# Patient Record
Sex: Male | Born: 1980
Health system: Southern US, Community
[De-identification: ages and names within clinical notes are randomized; demographics above are authoritative.]

---

## 2008-11-26 ENCOUNTER — Emergency Department (HOSPITAL_COMMUNITY): Admission: EM | Admit: 2008-11-26 | Discharge: 2008-11-26 | Payer: Self-pay | Admitting: Emergency Medicine

## 2009-02-26 ENCOUNTER — Emergency Department (HOSPITAL_COMMUNITY): Admission: EM | Admit: 2009-02-26 | Discharge: 2009-02-26 | Payer: Self-pay | Admitting: Emergency Medicine

## 2009-08-17 ENCOUNTER — Emergency Department (HOSPITAL_COMMUNITY): Admission: EM | Admit: 2009-08-17 | Discharge: 2009-08-17 | Payer: Self-pay | Admitting: Emergency Medicine

## 2010-05-16 ENCOUNTER — Emergency Department (HOSPITAL_COMMUNITY)
Admission: EM | Admit: 2010-05-16 | Discharge: 2010-05-17 | Payer: Self-pay | Source: Home / Self Care | Admitting: Emergency Medicine

## 2010-07-26 LAB — CBC
HCT: 48.2 % (ref 39.0–52.0)
MCH: 29.8 pg (ref 26.0–34.0)
MCV: 84.9 fL (ref 78.0–100.0)
RDW: 13.2 % (ref 11.5–15.5)
WBC: 9.9 10*3/uL (ref 4.0–10.5)

## 2010-07-26 LAB — URINALYSIS, ROUTINE W REFLEX MICROSCOPIC
Nitrite: NEGATIVE
Specific Gravity, Urine: 1.021 (ref 1.005–1.030)
Urobilinogen, UA: 0.2 mg/dL (ref 0.0–1.0)

## 2010-07-26 LAB — BASIC METABOLIC PANEL
BUN: 11 mg/dL (ref 6–23)
CO2: 24 mEq/L (ref 19–32)
Chloride: 105 mEq/L (ref 96–112)
Potassium: 4.2 mEq/L (ref 3.5–5.1)

## 2010-07-26 LAB — POCT CARDIAC MARKERS
CKMB, poc: 10.2 ng/mL (ref 1.0–8.0)
Troponin i, poc: <0.05 ng/mL (ref 0.00–0.09)

## 2010-07-26 LAB — DIFFERENTIAL
Basophils Absolute: 0 10*3/uL (ref 0.0–0.1)
Eosinophils Absolute: 0.1 10*3/uL (ref 0.0–0.7)
Eosinophils Relative: 1 % (ref 0–5)
Lymphocytes Relative: 7 % — ABNORMAL LOW (ref 12–46)
Lymphs Abs: 0.7 10*3/uL (ref 0.7–4.0)
Monocytes Absolute: 0.5 10*3/uL (ref 0.1–1.0)

## 2010-07-28 ENCOUNTER — Emergency Department (HOSPITAL_COMMUNITY)
Admission: EM | Admit: 2010-07-28 | Discharge: 2010-07-28 | Disposition: A | Discharge status: Home / Self Care | Payer: Medicaid Other | Attending: Emergency Medicine | Admitting: Emergency Medicine

## 2010-07-28 DIAGNOSIS — J45909 Unspecified asthma, uncomplicated: Secondary | ICD-10-CM | POA: Insufficient documentation

## 2010-07-28 DIAGNOSIS — J069 Acute upper respiratory infection, unspecified: Secondary | ICD-10-CM | POA: Insufficient documentation

## 2010-07-28 DIAGNOSIS — J029 Acute pharyngitis, unspecified: Secondary | ICD-10-CM | POA: Insufficient documentation

## 2010-07-28 LAB — RAPID STREP SCREEN (MED CTR MEBANE ONLY): Streptococcus, Group A Screen (Direct): NEGATIVE

## 2010-08-04 LAB — RAPID STREP SCREEN (MED CTR MEBANE ONLY): Streptococcus, Group A Screen (Direct): NEGATIVE

## 2011-02-27 ENCOUNTER — Emergency Department (HOSPITAL_COMMUNITY)
Admission: EM | Admit: 2011-02-27 | Discharge: 2011-02-28 | Disposition: A | Discharge status: Home / Self Care | Payer: Medicaid Other | Attending: Emergency Medicine | Admitting: Emergency Medicine

## 2011-02-27 ENCOUNTER — Emergency Department (HOSPITAL_COMMUNITY): Payer: Medicaid Other

## 2011-02-27 DIAGNOSIS — R0609 Other forms of dyspnea: Secondary | ICD-10-CM | POA: Insufficient documentation

## 2011-02-27 DIAGNOSIS — J45909 Unspecified asthma, uncomplicated: Secondary | ICD-10-CM | POA: Insufficient documentation

## 2011-02-27 DIAGNOSIS — R0789 Other chest pain: Secondary | ICD-10-CM | POA: Insufficient documentation

## 2011-02-27 DIAGNOSIS — R0989 Other specified symptoms and signs involving the circulatory and respiratory systems: Secondary | ICD-10-CM | POA: Insufficient documentation

## 2011-02-27 DIAGNOSIS — R0602 Shortness of breath: Secondary | ICD-10-CM | POA: Insufficient documentation

## 2011-02-27 LAB — COMPREHENSIVE METABOLIC PANEL
AST: 26 U/L (ref 0–37)
Albumin: 3.3 g/dL — ABNORMAL LOW (ref 3.5–5.2)
Calcium: 8.8 mg/dL (ref 8.4–10.5)
Creatinine, Ser: 1.21 mg/dL (ref 0.50–1.35)
GFR calc non Af Amer: 80 mL/min — ABNORMAL LOW (ref >90)

## 2011-02-27 LAB — POCT I-STAT TROPONIN I: Troponin i, poc: 0 ng/mL (ref 0.00–0.08)

## 2011-02-27 LAB — CBC
MCHC: 35.2 g/dL (ref 30.0–36.0)
Platelets: 168 10*3/uL (ref 150–400)
RDW: 13.4 % (ref 11.5–15.5)

## 2011-06-11 ENCOUNTER — Emergency Department (HOSPITAL_COMMUNITY)
Admission: EM | Admit: 2011-06-11 | Discharge: 2011-06-12 | Disposition: A | Discharge status: Home / Self Care | Payer: No Typology Code available for payment source | Attending: Emergency Medicine | Admitting: Emergency Medicine

## 2011-06-11 ENCOUNTER — Encounter (HOSPITAL_COMMUNITY): Payer: Self-pay | Admitting: *Deleted

## 2011-06-11 DIAGNOSIS — J45909 Unspecified asthma, uncomplicated: Secondary | ICD-10-CM | POA: Insufficient documentation

## 2011-06-11 DIAGNOSIS — M546 Pain in thoracic spine: Secondary | ICD-10-CM | POA: Insufficient documentation
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
## 2011-06-12 ENCOUNTER — Emergency Department (HOSPITAL_COMMUNITY): Payer: No Typology Code available for payment source

## 2011-06-12 MED ORDER — CYCLOBENZAPRINE HCL 10 MG PO TABS
5.0000 mg | ORAL_TABLET | Freq: Once | ORAL | Status: AC
  Administered 2011-06-12: 5 mg via ORAL
  Filled 2011-06-12: qty 1

## 2011-06-12 MED ORDER — CYCLOBENZAPRINE HCL 10 MG PO TABS: 10.0000 mg | ORAL_TABLET | Freq: Two times a day (BID) | ORAL | Status: AC | PRN

## 2011-06-12 MED ORDER — IBUPROFEN 800 MG PO TABS: 800.0000 mg | ORAL_TABLET | Freq: Three times a day (TID) | ORAL | Status: AC

## 2011-06-12 MED ORDER — IBUPROFEN 800 MG PO TABS
800.0000 mg | ORAL_TABLET | Freq: Once | ORAL | Status: AC
  Administered 2011-06-12: 800 mg via ORAL
  Filled 2011-06-12: qty 1

## 2011-06-12 NOTE — ED Notes (Signed)
Ice pack applied to upper back
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
## 2011-11-12 IMAGING — CR DG CHEST 2V
2 series · 2 of 2 positions shown · non-contrast
Comparison: None.

CLINICAL DATA: Chest pain and shortness of breath, with nausea and
vomiting; history of asthma.

CHEST - 2 VIEW

[w chest pa]
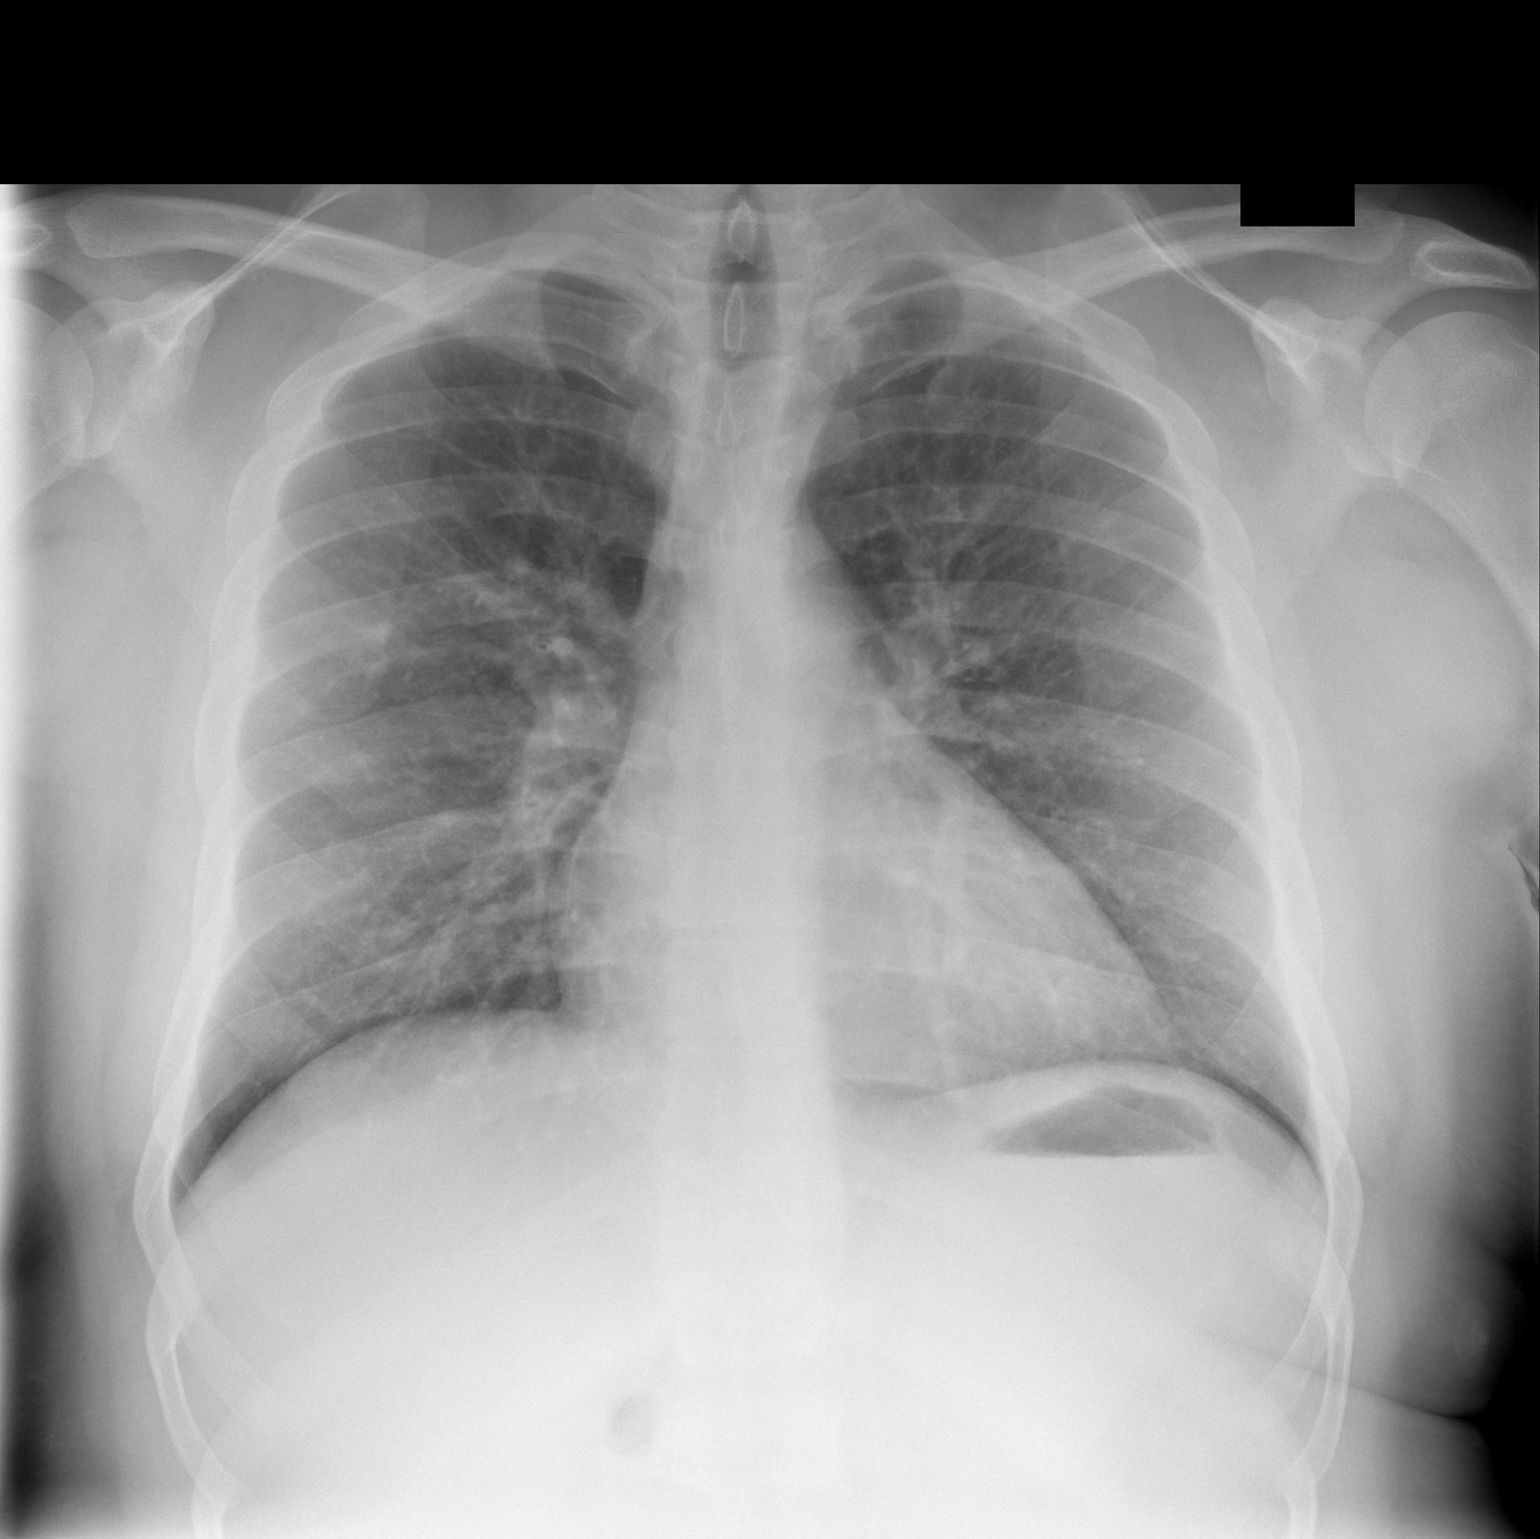

[w chest lat]
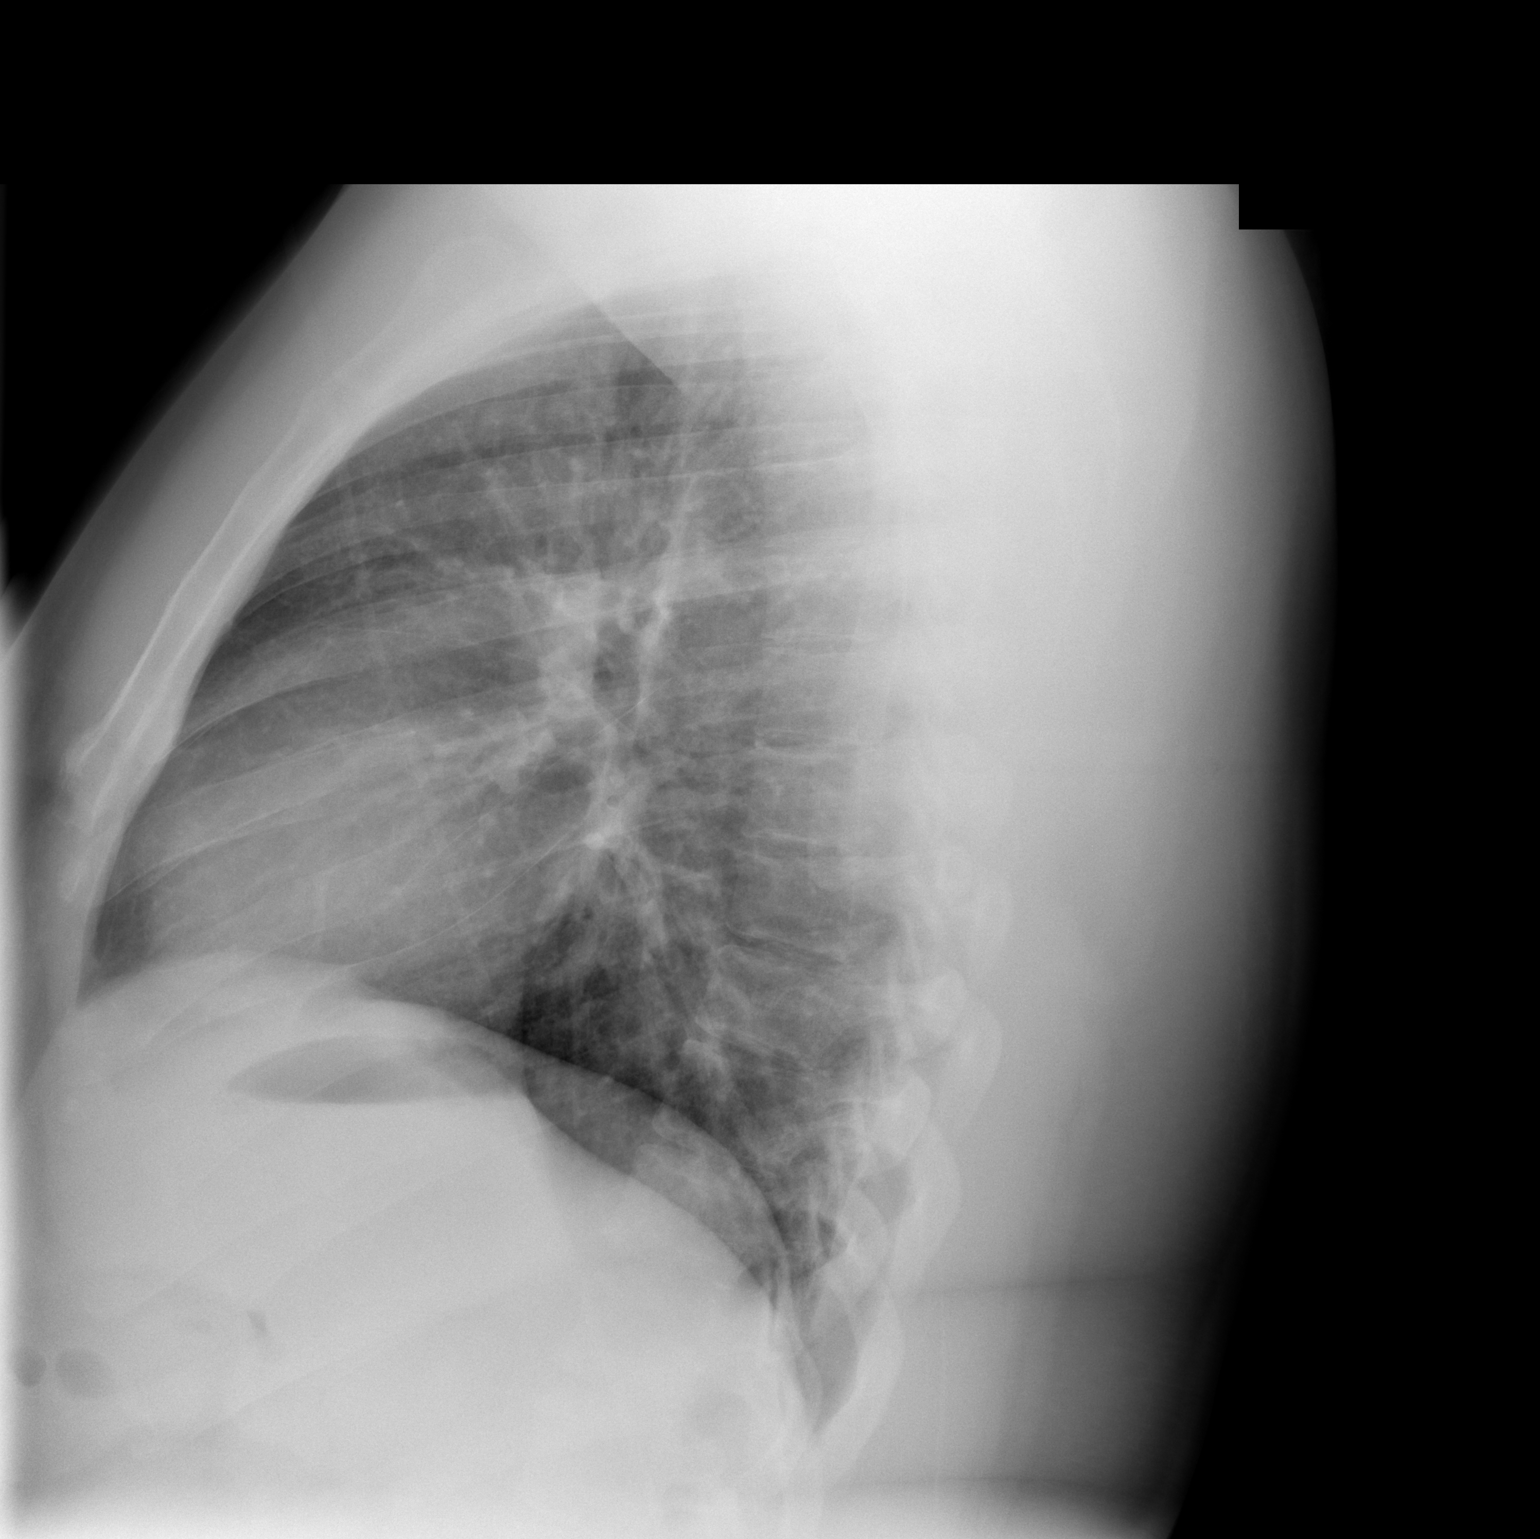

[2 of 2 positions shown; findings below may reference images not displayed]

FINDINGS: The lungs are well-aerated; mild peribronchial thickening
is noted.  There is no evidence of focal opacification, pleural
effusion or pneumothorax.  A nodular density at the right mid lung
zone likely reflects a bone island, particularly given the
patient's age.

The heart is normal in size; the mediastinal contour is within
normal limits.  No acute osseous abnormalities are seen.
IMPRESSION: Mild peribronchial thickening noted; lungs otherwise clear.

## 2011-11-16 ENCOUNTER — Encounter (HOSPITAL_COMMUNITY): Payer: Self-pay | Admitting: *Deleted

## 2011-11-16 ENCOUNTER — Emergency Department (HOSPITAL_COMMUNITY): Payer: Medicaid Other

## 2011-11-16 ENCOUNTER — Emergency Department (HOSPITAL_COMMUNITY)
Admission: EM | Admit: 2011-11-16 | Discharge: 2011-11-16 | Disposition: A | Discharge status: Home / Self Care | Payer: Medicaid Other | Attending: Emergency Medicine | Admitting: Emergency Medicine

## 2011-11-16 DIAGNOSIS — J209 Acute bronchitis, unspecified: Secondary | ICD-10-CM | POA: Insufficient documentation

## 2011-11-16 DIAGNOSIS — R0602 Shortness of breath: Secondary | ICD-10-CM | POA: Insufficient documentation

## 2011-11-16 DIAGNOSIS — J45901 Unspecified asthma with (acute) exacerbation: Secondary | ICD-10-CM | POA: Insufficient documentation

## 2011-11-16 MED ORDER — AMOXICILLIN 500 MG PO CAPS: 500.0000 mg | ORAL_CAPSULE | Freq: Three times a day (TID) | ORAL | Status: AC

## 2011-11-16 MED ORDER — ALBUTEROL SULFATE HFA 108 (90 BASE) MCG/ACT IN AERS
2.0000 | INHALATION_SPRAY | Freq: Once | RESPIRATORY_TRACT | Status: AC
  Administered 2011-11-16: 2 via RESPIRATORY_TRACT
  Filled 2011-11-16: qty 6.7

## 2011-11-16 NOTE — ED Notes (Signed)
Pt d/c home in NAD. Pt voiced understanding of d/c instructions and follow up care. Pt denies any pain.

## 2011-11-16 NOTE — ED Notes (Signed)
Pt is here for asthma flare up.  Pt has had a cough for 2-3 days with some soreness in his left ribs when he coughs and some intermittent sob.  Pt ran out of his meds today and would also like a refill.  No distress in triage

## 2011-11-16 NOTE — ED Provider Notes (Signed)
History     CSN: [id number]  Arrival date & time 11/16/11  2031   First MD Initiated Contact with Patient 11/16/11 2227      Chief Complaint  Patient presents with  . Asthma    (Consider location/radiation/quality/duration/timing/severity/associated sxs/prior treatment) Patient is a 31 y.o. male presenting with asthma. The history is provided by the patient.  Asthma This is a recurrent problem. Episode onset: 4-5 days ago. The problem occurs constantly. The problem has been gradually worsening. Associated symptoms include shortness of breath. Pertinent negatives include no chest pain. Nothing aggravates the symptoms. Nothing relieves the symptoms. Treatments tried: mdi but ran out of today. The treatment provided no relief.    Past Medical History  Diagnosis Date  . Asthma     History reviewed. No pertinent past surgical history.  No family history on file.  History  Substance Use Topics  . Smoking status: Never Smoker   . Smokeless tobacco: Not on file  . Alcohol Use: Yes      Review of Systems  Respiratory: Positive for shortness of breath.   Cardiovascular: Negative for chest pain.  All other systems reviewed and are negative.    Allergies  Review of patient's allergies indicates no known allergies.  Home Medications   Current Outpatient Rx  Name Route Sig Dispense Refill  . ALBUTEROL SULFATE HFA 108 (90 BASE) MCG/ACT IN AERS Inhalation Inhale 2 puffs into the lungs every 4 (four) hours as needed. For shortness of breath/asthma symptoms    . OXYCODONE-ACETAMINOPHEN 5-325 MG PO TABS Oral Take 1 tablet by mouth every 6 (six) hours as needed. For pain.      BP 126/76  Pulse 89  Temp 98.8 F (37.1 C) (Oral)  Resp 22  SpO2 98%  Physical Exam  Nursing note and vitals reviewed. Constitutional: He is oriented to person, place, and time. He appears well-developed and well-nourished. No distress.  HENT:  Head: Normocephalic and atraumatic.  Mouth/Throat:  Oropharynx is clear and moist.  Neck: Normal range of motion. Neck supple.  Cardiovascular: Normal rate and regular rhythm.   No murmur heard. Pulmonary/Chest: Effort normal and breath sounds normal. No respiratory distress.  Abdominal: Soft. Bowel sounds are normal. He exhibits no distension. There is no tenderness.  Musculoskeletal: Normal range of motion. He exhibits no edema.  Lymphadenopathy:    He has no cervical adenopathy.  Neurological: He is alert and oriented to person, place, and time.  Skin: Skin is warm and dry. He is not diaphoretic.    ED Course  Procedures (including critical care time)  Labs Reviewed - No data to display Dg Chest 2 View  11/16/2011  *RADIOLOGY REPORT*  Clinical Data: Asthma.  CHEST - 2 VIEW  Comparison: 02/28/2011  Findings: Stable mild peribronchial thickening. Heart and mediastinal contours are within normal limits.  No focal opacities or effusions.  No acute bony abnormality.  IMPRESSION: Stable bronchitic changes.  Original Report Authenticated By: Cyndie Chime, M.D.     No diagnosis found.    MDM  The xrays show bronchitic changes.  Will treat with amox, mdi.  To return prn if worsens.        Geoffery Lyons, MD 11/16/11 2235

## 2011-11-16 NOTE — ED Notes (Signed)
Pt states "I've been having issues with my asthma the past 2-3 days because I ran out of my inhaler. I also have a pain in here (points to left side of ribs) when I cough." Pt in NAD. Pt states that he is coughing up clear phlegm. Lungs clear to auscultation. 98% on RA.

## 2012-03-04 ENCOUNTER — Emergency Department (HOSPITAL_COMMUNITY)
Admission: EM | Admit: 2012-03-04 | Discharge: 2012-03-05 | Disposition: A | Discharge status: Home / Self Care | Payer: No Typology Code available for payment source | Attending: Emergency Medicine | Admitting: Emergency Medicine

## 2012-03-04 ENCOUNTER — Encounter (HOSPITAL_COMMUNITY): Payer: Self-pay | Admitting: *Deleted

## 2012-03-04 DIAGNOSIS — M549 Dorsalgia, unspecified: Secondary | ICD-10-CM | POA: Insufficient documentation

## 2012-03-04 DIAGNOSIS — Y92410 Unspecified street and highway as the place of occurrence of the external cause: Secondary | ICD-10-CM | POA: Insufficient documentation

## 2012-03-04 DIAGNOSIS — J45909 Unspecified asthma, uncomplicated: Secondary | ICD-10-CM | POA: Insufficient documentation
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
## 2012-03-05 ENCOUNTER — Emergency Department (HOSPITAL_COMMUNITY): Payer: No Typology Code available for payment source

## 2012-03-05 DIAGNOSIS — M549 Dorsalgia, unspecified: Secondary | ICD-10-CM | POA: Diagnosis not present

## 2012-03-05 MED ORDER — KETOROLAC TROMETHAMINE 60 MG/2ML IM SOLN
60.0000 mg | Freq: Once | INTRAMUSCULAR | Status: AC
  Administered 2012-03-05: 60 mg via INTRAMUSCULAR
  Filled 2012-03-05: qty 2

## 2012-03-05 MED ORDER — NAPROXEN 500 MG PO TABS: 500.0000 mg | ORAL_TABLET | Freq: Two times a day (BID) | ORAL | Status: DC
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
## 2012-04-24 ENCOUNTER — Emergency Department (HOSPITAL_COMMUNITY)
Admission: EM | Admit: 2012-04-24 | Discharge: 2012-04-24 | Disposition: A | Discharge status: Home / Self Care | Payer: Medicaid Other | Source: Home / Self Care | Attending: Emergency Medicine | Admitting: Emergency Medicine

## 2012-04-24 ENCOUNTER — Emergency Department (INDEPENDENT_AMBULATORY_CARE_PROVIDER_SITE_OTHER): Payer: Medicaid Other

## 2012-04-24 ENCOUNTER — Encounter (HOSPITAL_COMMUNITY): Payer: Self-pay | Admitting: *Deleted

## 2012-04-24 DIAGNOSIS — M7750 Other enthesopathy of unspecified foot: Secondary | ICD-10-CM

## 2012-04-24 DIAGNOSIS — M659 Synovitis and tenosynovitis, unspecified: Secondary | ICD-10-CM

## 2012-04-24 MED ORDER — MELOXICAM 7.5 MG PO TABS: 7.5000 mg | ORAL_TABLET | Freq: Every day | ORAL | Status: DC

## 2012-04-24 NOTE — ED Provider Notes (Signed)
History     CSN: [id number]  Arrival date & time 04/24/12  1434   First MD Initiated Contact with Patient 04/24/12 1532      Chief Complaint  Patient presents with  . Foot Pain    (Consider location/radiation/quality/duration/timing/severity/associated sxs/prior treatment) HPI Comments: The patient presents urgent care complaining of right foot pain for about a week. He denies any known injuries portions falls uses hurting in the lateral aspect of his right foot. He does describe that he's been walking a lot more lately. Denies any numbness, or tingling sensation distally or proximally to his right foot.  Patient is a 31 y.o. male presenting with lower extremity pain. The history is provided by the patient.  Foot Pain This is a new problem. The problem occurs constantly. The problem has been gradually worsening. Pertinent negatives include no chest pain. The symptoms are aggravated by walking. He has tried nothing for the symptoms. The treatment provided no relief.    Past Medical History  Diagnosis Date  . Asthma     History reviewed. No pertinent past surgical history.  History reviewed. No pertinent family history.  History  Substance Use Topics  . Smoking status: Never Smoker   . Smokeless tobacco: Not on file  . Alcohol Use: Yes      Review of Systems  Constitutional: Positive for activity change. Negative for fever, chills, diaphoresis, appetite change and fatigue.  Cardiovascular: Negative for chest pain.  Gastrointestinal: Negative for vomiting.  Musculoskeletal: Negative for myalgias, back pain and arthralgias.  Skin: Negative for color change, rash and wound.    Allergies  Review of patient's allergies indicates no known allergies.  Home Medications   Current Outpatient Rx  Name  Route  Sig  Dispense  Refill  . ALBUTEROL SULFATE HFA 108 (90 BASE) MCG/ACT IN AERS   Inhalation   Inhale 2 puffs into the lungs every 4 (four) hours as needed. For  shortness of breath/asthma symptoms         . NAPROXEN 500 MG PO TABS   Oral   Take 1 tablet (500 mg total) by mouth 2 (two) times daily with a meal.   30 tablet   0     BP 147/72  Pulse 83  Temp 98.3 F (36.8 C) (Oral)  Resp 20  SpO2 96%  Physical Exam  Nursing note and vitals reviewed. Constitutional: He is oriented to person, place, and time. Vital signs are normal. He appears well-developed and well-nourished.  Non-toxic appearance. He does not have a sickly appearance. He does not appear ill. No distress.  Musculoskeletal: He exhibits tenderness. He exhibits no edema.       Feet:  Neurological: He is alert and oriented to person, place, and time.  Skin: Skin is dry. No rash noted. No erythema.    ED Course  Procedures (including critical care time)  Labs Reviewed - No data to display No results found.   No diagnosis found.    MDM  Right foot pain/midfoot area fifth metatarsal region. We are x-ray and his foot to rule out a stress fracture. Will probably be put on a postop shoe and encouraged to followup in 4 to about 10-14 days.        Jimmie Molly, MD 04/24/12 [phone]

## 2012-04-24 NOTE — ED Notes (Signed)
Pt reports 1 week of right lateral foot pain.  He denies injury, but has walked a lot lately

## 2012-04-30 ENCOUNTER — Encounter (HOSPITAL_COMMUNITY): Payer: Self-pay | Admitting: Emergency Medicine

## 2012-04-30 ENCOUNTER — Other Ambulatory Visit: Payer: Self-pay

## 2012-04-30 ENCOUNTER — Emergency Department (HOSPITAL_COMMUNITY)
Admission: EM | Admit: 2012-04-30 | Discharge: 2012-04-30 | Disposition: A | Discharge status: Home / Self Care | Payer: Medicaid Other | Attending: Emergency Medicine | Admitting: Emergency Medicine

## 2012-04-30 ENCOUNTER — Emergency Department (HOSPITAL_COMMUNITY): Payer: Medicaid Other

## 2012-04-30 DIAGNOSIS — Z79899 Other long term (current) drug therapy: Secondary | ICD-10-CM | POA: Insufficient documentation

## 2012-04-30 DIAGNOSIS — J45901 Unspecified asthma with (acute) exacerbation: Secondary | ICD-10-CM | POA: Insufficient documentation

## 2012-04-30 MED ORDER — DEXAMETHASONE 1 MG/ML PO CONC
16.0000 mg | Freq: Once | ORAL | Status: DC
  Filled 2012-04-30: qty 16

## 2012-04-30 MED ORDER — DEXAMETHASONE 4 MG PO TABS: 4.0000 mg | ORAL_TABLET | Freq: Two times a day (BID) | ORAL | Status: DC

## 2012-04-30 MED ORDER — DEXAMETHASONE 6 MG PO TABS
16.0000 mg | ORAL_TABLET | Freq: Once | ORAL | Status: AC
  Administered 2012-04-30: 16 mg via ORAL
  Filled 2012-04-30: qty 1

## 2012-04-30 MED ORDER — LORAZEPAM 1 MG PO TABS
1.0000 mg | ORAL_TABLET | Freq: Once | ORAL | Status: AC
  Administered 2012-04-30: 1 mg via ORAL
  Filled 2012-04-30: qty 1

## 2012-04-30 MED ORDER — ALBUTEROL SULFATE (5 MG/ML) 0.5% IN NEBU
15.0000 mg | INHALATION_SOLUTION | Freq: Once | RESPIRATORY_TRACT | Status: AC
  Administered 2012-04-30: 15 mg via RESPIRATORY_TRACT
  Filled 2012-04-30 (×2): qty 3

## 2012-04-30 NOTE — ED Notes (Signed)
MWU:[id number]<MW> Expected date:<BR> Expected time:<BR> Means of arrival:<BR> Comments:<BR> EMS/dyspnea/wheezing-neb tx being given

## 2012-04-30 NOTE — ED Provider Notes (Signed)
History    31 year old male with a chief complaint of dyspnea. Onset about 2 days ago and progressively worsening. Patient has a history of asthma and says her current symptoms feel like an exacerbation. He has been using albuterol at home with minimal relief. No fevers or chills. His chest is tight. A history of blood clot. No unusual leg pain or swelling. Patient has been previously admitted for asthma exacerbation but has never been intubated or to the ICU.  CSN: [id number]  Arrival date & time 04/30/12  2103   None     Chief Complaint  Patient presents with  . Shortness of Breath    (Consider location/radiation/quality/duration/timing/severity/associated sxs/prior treatment) HPI  Past Medical History  Diagnosis Date  . Asthma     No past surgical history on file.  No family history on file.  History  Substance Use Topics  . Smoking status: Never Smoker   . Smokeless tobacco: Not on file  . Alcohol Use: Yes      Review of Systems  All systems reviewed and negative, other than as noted in HPI.   Allergies  Review of patient's allergies indicates no known allergies.  Home Medications   Current Outpatient Rx  Name  Route  Sig  Dispense  Refill  . ALBUTEROL SULFATE HFA 108 (90 BASE) MCG/ACT IN AERS   Inhalation   Inhale 2 puffs into the lungs every 4 (four) hours as needed. For shortness of breath/asthma symptoms         . MELOXICAM 7.5 MG PO TABS   Oral   Take 1 tablet (7.5 mg total) by mouth daily. Take one tablet daily for 2 weeks   14 tablet   0     BP 163/79  Pulse 116  Temp 100 F (37.8 C) (Axillary)  Resp 21  SpO2 97%  Physical Exam  Nursing note and vitals reviewed. Constitutional: He appears well-developed and well-nourished. No distress.  HENT:  Head: Normocephalic and atraumatic.  Eyes: Conjunctivae normal are normal. Right eye exhibits no discharge. Left eye exhibits no discharge.  Neck: Neck supple.  Cardiovascular: Regular  rhythm and normal heart sounds.  Exam reveals no gallop and no friction rub.   No murmur heard.      Tachycardic with a regular rhythm  Pulmonary/Chest:       Mild tachypnea. Expiratory wheezing bilaterally. Mildly prolonged expiratory phase the  Abdominal: Soft. He exhibits no distension. There is no tenderness.  Musculoskeletal: He exhibits no edema and no tenderness.       Lower extremities symmetric as compared to each other. No calf tenderness. Negative Homan's. No palpable cords.   Neurological: He is alert.  Skin: Skin is warm and dry.  Psychiatric: He has a normal mood and affect. His behavior is normal. Thought content normal.    ED Course  Procedures (including critical care time)  Labs Reviewed - No data to display Dg Chest 2 View  04/30/2012  *RADIOLOGY REPORT*  Clinical Data: Shortness of breath.  History of asthma.  CHEST - 2 VIEW  Comparison: 11/16/2011.  Findings: The cardiac silhouette is near the upper limit of normal in size with a mild interval increase in size.  Clear lungs with normal vascularity.  Unremarkable bones.  IMPRESSION: Interval heart size near the upper limit of normal.  Otherwise, unremarkable examination.   Original Report Authenticated By: Beckie Salts, M.D.      1. Asthma exacerbation       MDM  31 year old male  with asthma exacerbation. Subjectively and clinically much better after hour-long nebulized treatment of the anus. Patient is given a dose of Decadron and a prescription for a repeat dose. He reports that he has a complaining of albuterol at home and does not need further prescription at this time. He is afebrile. Chest is clear. Neck is safe for discharge at this time. Emergent return cautions were discussed.        Raeford Razor, MD 05/02/12 [phone]

## 2012-04-30 NOTE — ED Notes (Signed)
Brought in by EMS from home with c/o shortness of breath.  Per EMS, pt has wheezes on all lung fields on their arrival.  Per pt's report, he has been having colds and SOB for the past 2 days and unrelieved by his inhalers.  Pt was given neb treatment en route to ED.  Pt presents  to room 10 ambulatory.

## 2012-09-15 ENCOUNTER — Encounter (HOSPITAL_COMMUNITY): Payer: Self-pay | Admitting: Emergency Medicine

## 2012-09-15 ENCOUNTER — Emergency Department (HOSPITAL_COMMUNITY)
Admission: EM | Admit: 2012-09-15 | Discharge: 2012-09-15 | Disposition: A | Discharge status: Home / Self Care | Payer: Medicaid Other | Attending: Emergency Medicine | Admitting: Emergency Medicine

## 2012-09-15 DIAGNOSIS — J45901 Unspecified asthma with (acute) exacerbation: Secondary | ICD-10-CM | POA: Insufficient documentation

## 2012-09-15 DIAGNOSIS — J45909 Unspecified asthma, uncomplicated: Secondary | ICD-10-CM

## 2012-09-15 DIAGNOSIS — Z79899 Other long term (current) drug therapy: Secondary | ICD-10-CM | POA: Insufficient documentation

## 2012-09-15 DIAGNOSIS — Z760 Encounter for issue of repeat prescription: Secondary | ICD-10-CM

## 2012-09-15 MED ORDER — ALBUTEROL SULFATE HFA 108 (90 BASE) MCG/ACT IN AERS: 2.0000 | INHALATION_SPRAY | RESPIRATORY_TRACT | Status: DC | PRN

## 2012-09-15 MED ORDER — ALBUTEROL SULFATE HFA 108 (90 BASE) MCG/ACT IN AERS
2.0000 | INHALATION_SPRAY | RESPIRATORY_TRACT | Status: DC
  Administered 2012-09-15: 2 via RESPIRATORY_TRACT
  Filled 2012-09-15: qty 6.7

## 2012-09-15 NOTE — ED Notes (Signed)
Pt to ED with c/o SOB onset yesterday but denies SOB breath now. Pt states he run out of albuterol healer and needs refill.

## 2012-09-15 NOTE — ED Provider Notes (Signed)
History     CSN: [id number]  Arrival date & time 09/15/12  1720   First MD Initiated Contact with Patient 09/15/12 1739      Chief Complaint  Patient presents with  . Shortness of Breath   The history is provided by the patient.   the patient reports a history of asthma but is currently out of his albuterol.  He had some shortness of breath yesterday but has no significant shortness of breath now.  He's mainly requesting an albuterol inhaler.  No fevers or chills.  No productive cough.  No unilateral leg swelling.  No other medical problems.  His symptoms are only very mild at this time.  Nothing improves or worsens the symptoms.  Past Medical History  Diagnosis Date  . Asthma     History reviewed. No pertinent past surgical history.  History reviewed. No pertinent family history.  History  Substance Use Topics  . Smoking status: Never Smoker   . Smokeless tobacco: Not on file  . Alcohol Use: Yes      Review of Systems  Respiratory: Positive for shortness of breath.   All other systems reviewed and are negative.    Allergies  Review of patient's allergies indicates no known allergies.  Home Medications   Current Outpatient Rx  Name  Route  Sig  Dispense  Refill  . albuterol (PROVENTIL HFA;VENTOLIN HFA) 108 (90 BASE) MCG/ACT inhaler   Inhalation   Inhale 2 puffs into the lungs every 4 (four) hours as needed. For shortness of breath/asthma symptoms         . albuterol (PROVENTIL HFA;VENTOLIN HFA) 108 (90 BASE) MCG/ACT inhaler   Inhalation   Inhale 2 puffs into the lungs every 4 (four) hours as needed for wheezing.   1 Inhaler   0     BP 153/77  Pulse 99  Temp(Src) 99.1 F (37.3 C) (Oral)  Resp 18  SpO2 95%  Physical Exam  Nursing note and vitals reviewed. Constitutional: He is oriented to person, place, and time. He appears well-developed and well-nourished.  HENT:  Head: Normocephalic and atraumatic.  Eyes: EOM are normal.  Neck: Normal range of  motion.  Cardiovascular: Normal rate, regular rhythm, normal heart sounds and intact distal pulses.   Pulmonary/Chest: Effort normal and breath sounds normal. No respiratory distress.  Abdominal: Soft. He exhibits no distension. There is no tenderness.  Genitourinary: Rectum normal.  Musculoskeletal: Normal range of motion.  Neurological: He is alert and oriented to person, place, and time.  Skin: Skin is warm and dry.  Psychiatric: He has a normal mood and affect. Judgment normal.    ED Course  Procedures (including critical care time)  Labs Reviewed - No data to display No results found.   1. Asthma   2. Medication refill       MDM  Patient feels much better after 2 puffs of his albuterol.  Home with albuterol.  No indication for steroids.  This was mainly a medication refill visit.        Lyanne Co, MD 09/15/12 [phone]

## 2013-02-18 ENCOUNTER — Encounter (HOSPITAL_COMMUNITY): Payer: Self-pay | Admitting: Emergency Medicine

## 2013-02-18 ENCOUNTER — Emergency Department (HOSPITAL_COMMUNITY)
Admission: EM | Admit: 2013-02-18 | Discharge: 2013-02-18 | Disposition: A | Discharge status: Home / Self Care | Payer: Medicaid Other | Attending: Emergency Medicine | Admitting: Emergency Medicine

## 2013-02-18 ENCOUNTER — Emergency Department (HOSPITAL_COMMUNITY): Payer: Medicaid Other

## 2013-02-18 DIAGNOSIS — J45901 Unspecified asthma with (acute) exacerbation: Secondary | ICD-10-CM | POA: Insufficient documentation

## 2013-02-18 DIAGNOSIS — M940 Chondrocostal junction syndrome [Tietze]: Secondary | ICD-10-CM | POA: Insufficient documentation

## 2013-02-18 DIAGNOSIS — Z79899 Other long term (current) drug therapy: Secondary | ICD-10-CM | POA: Insufficient documentation

## 2013-02-18 LAB — BASIC METABOLIC PANEL
BUN: 13 mg/dL (ref 6–23)
CO2: 21 mEq/L (ref 19–32)
Calcium: 8.4 mg/dL (ref 8.4–10.5)
Creatinine, Ser: 0.94 mg/dL (ref 0.50–1.35)

## 2013-02-18 LAB — CBC WITH DIFFERENTIAL/PLATELET
Basophils Absolute: 0 10*3/uL (ref 0.0–0.1)
Eosinophils Relative: 5 % (ref 0–5)
HCT: 44.5 % (ref 39.0–52.0)
Hemoglobin: 15.5 g/dL (ref 13.0–17.0)
Lymphocytes Relative: 35 % (ref 12–46)
MCHC: 34.8 g/dL (ref 30.0–36.0)
MCV: 86.1 fL (ref 78.0–100.0)
Monocytes Absolute: 0.4 10*3/uL (ref 0.1–1.0)
Monocytes Relative: 7 % (ref 3–12)
RDW: 13.2 % (ref 11.5–15.5)
WBC: 5.6 10*3/uL (ref 4.0–10.5)

## 2013-02-18 LAB — POCT I-STAT TROPONIN I: Troponin i, poc: 0 ng/mL (ref 0.00–0.08)

## 2013-02-18 MED ORDER — PREDNISONE 50 MG PO TABS: 50.0000 mg | ORAL_TABLET | Freq: Every day | ORAL | Status: DC

## 2013-02-18 MED ORDER — IBUPROFEN 600 MG PO TABS: 600.0000 mg | ORAL_TABLET | Freq: Four times a day (QID) | ORAL | Status: DC | PRN

## 2013-02-18 MED ORDER — IPRATROPIUM BROMIDE 0.02 % IN SOLN
0.5000 mg | Freq: Once | RESPIRATORY_TRACT | Status: AC
  Administered 2013-02-18: 0.5 mg via RESPIRATORY_TRACT
  Filled 2013-02-18: qty 2.5

## 2013-02-18 MED ORDER — PREDNISONE 20 MG PO TABS
60.0000 mg | ORAL_TABLET | Freq: Once | ORAL | Status: AC
  Administered 2013-02-18: 60 mg via ORAL
  Filled 2013-02-18: qty 3

## 2013-02-18 MED ORDER — ALBUTEROL (5 MG/ML) CONTINUOUS INHALATION SOLN
10.0000 mg/h | INHALATION_SOLUTION | Freq: Once | RESPIRATORY_TRACT | Status: AC
  Administered 2013-02-18: 10 mg/h via RESPIRATORY_TRACT
  Filled 2013-02-18: qty 20

## 2013-02-18 MED ORDER — KETOROLAC TROMETHAMINE 30 MG/ML IJ SOLN
30.0000 mg | Freq: Once | INTRAMUSCULAR | Status: AC
  Administered 2013-02-18: 30 mg via INTRAVENOUS
  Filled 2013-02-18: qty 1

## 2013-02-18 NOTE — ED Provider Notes (Addendum)
CSN: [id number]     Arrival date & time 02/18/13  7322 History   First MD Initiated Contact with Patient 02/18/13 1002     Chief Complaint  Patient presents with  . Chest Pain  . Shortness of Breath   (Consider location/radiation/quality/duration/timing/severity/associated sxs/prior Treatment) HPI Comments: Pt comes in with cc of chest pain. Pt has hx of asthma, no other medical hx, doesn't smoke cigarettes and no premature CAD in the family. Reports that he has been having some chest pain since this am, right sided ,worse with inspiration and cough. The pain is not exertional. No n/v/f/c. No wheezing, new cough. Pt took albuterol, no relief. No hx of PE, DVT, and no risk factors for the same.   Patient is a 32 y.o. male presenting with chest pain and shortness of breath. The history is provided by the patient.  Chest Pain Associated symptoms: no abdominal pain, no cough and no shortness of breath   Shortness of Breath Associated symptoms: chest pain   Associated symptoms: no abdominal pain, no cough and no wheezing     Past Medical History  Diagnosis Date  . Asthma    History reviewed. No pertinent past surgical history. History reviewed. No pertinent family history. History  Substance Use Topics  . Smoking status: Never Smoker   . Smokeless tobacco: Not on file  . Alcohol Use: Yes    Review of Systems  Constitutional: Negative for activity change and appetite change.  Respiratory: Negative for cough, shortness of breath and wheezing.   Cardiovascular: Positive for chest pain.  Gastrointestinal: Negative for abdominal pain.  Genitourinary: Negative for dysuria.    Allergies  Review of patient's allergies indicates no known allergies.  Home Medications   Current Outpatient Rx  Name  Route  Sig  Dispense  Refill  . albuterol (PROVENTIL HFA;VENTOLIN HFA) 108 (90 BASE) MCG/ACT inhaler   Inhalation   Inhale 2 puffs into the lungs every 4 (four) hours as needed. For  shortness of breath/asthma symptoms         . naproxen (NAPROSYN) 375 MG tablet   Oral   Take 375 mg by mouth 2 (two) times daily as needed (pain).         Marland Kitchen oxyCODONE-acetaminophen (PERCOCET) 10-325 MG per tablet   Oral   Take 1 tablet by mouth every 4 (four) hours as needed for pain.          BP 119/78  Pulse 82  Temp(Src) 98.6 F (37 C) (Oral)  Resp 15  SpO2 100% Physical Exam  Nursing note and vitals reviewed. Constitutional: He is oriented to person, place, and time. He appears well-developed.  HENT:  Head: Normocephalic and atraumatic.  Eyes: Conjunctivae and EOM are normal. Pupils are equal, round, and reactive to light.  Neck: Normal range of motion. Neck supple. No JVD present.  Cardiovascular: Normal rate and regular rhythm.   Pulmonary/Chest: Effort normal and breath sounds normal. He has no wheezes. He has no rales. He exhibits tenderness.  Diffuse poor air entry, reproducible chest tenderness.  Abdominal: Soft. Bowel sounds are normal. He exhibits no distension. There is no tenderness. There is no rebound and no guarding.  Musculoskeletal: He exhibits no edema and no tenderness.  Neurological: He is alert and oriented to person, place, and time.  Skin: Skin is warm.    ED Course  Procedures (including critical care time) Labs Review Labs Reviewed  BASIC METABOLIC PANEL - Abnormal; Notable for the following:  Sodium 133 (*)    Glucose, Bld 111 (*)    All other components within normal limits  CBC WITH DIFFERENTIAL  POCT I-STAT TROPONIN I  POCT I-STAT TROPONIN I   Imaging Review Dg Chest Port 1 View  02/18/2013   CLINICAL DATA:  Chest pain and shortness of breath.  EXAM: PORTABLE CHEST - 1 VIEW  COMPARISON:  None.  FINDINGS: And cardiac enlargement is exaggerated by right low lung volumes. Mild pulmonary vascular congestion is evident. There is no frank edema. No focal airspace consolidation is evident. The visualized soft tissues and bony thorax are  unremarkable. Borderline cardiomegaly and mild pulmonary vascular congestion.  IMPRESSION: Borderline cardiomegaly and mild pulmonary vascular congestion.   Electronically Signed   By: Gennette Pac M.D.   On: 02/18/2013 10:43    MDM  No diagnosis found.  Pt comes in with cc of chest pain, right sided, pleuritic.  Differential diagnosis includes: ACS syndrome Myocarditis Pericarditis Pericardial effusion Pneumonia Pleural effusion PE Musculoskeletal pain  Pt has 0 cardiac risk factors. Will get trops x 2, his pain is very atypical as well for cardiac etiology.  Lung exam is + for tight chest, with mild improvement with nebs. No wheezing appreciated at any point. Will treat as asthma exacerbation/bronchitis.  Pt's WELLS score is 0 and he is PERC negative   Date: 02/18/2013  Rate: 79  Rhythm: normal sinus rhythm  QRS Axis: normal  Intervals: normal  ST/T Wave abnormalities: nonspecific ST/T changes  Conduction Disutrbances:none  Narrative Interpretation:   Old EKG Reviewed: changes noted  Mild ST changes in v1, v2 - elevation.     Derwood Kaplan, MD 02/18/13 1502  Date: 02/18/2013  Rate: 85  Rhythm: normal sinus rhythm  QRS Axis: normal  Intervals: normal  ST/T Wave abnormalities: nonspecific ST/T changes  Conduction Disutrbances:none  Narrative Interpretation:   Old EKG Reviewed: changes noted  Mild ST changes in v1, v2 - J point elevation.   Derwood Kaplan, MD 02/18/13 1659

## 2013-02-18 NOTE — ED Notes (Signed)
Pt states that he woke up with chest pain this morning with SOB. States that he used his rescue inhaler with no relief. O2 Sat 100%. Denies n/v/d.

## 2013-10-20 IMAGING — CR DG FOOT COMPLETE 3+V*R*
3 series · 3 of 3 positions shown · non-contrast
Comparison: None.

CLINICAL DATA: Pain for 1 week duration

RIGHT FOOT COMPLETE - 3+ VIEW

[view not recorded (1 of 3)]
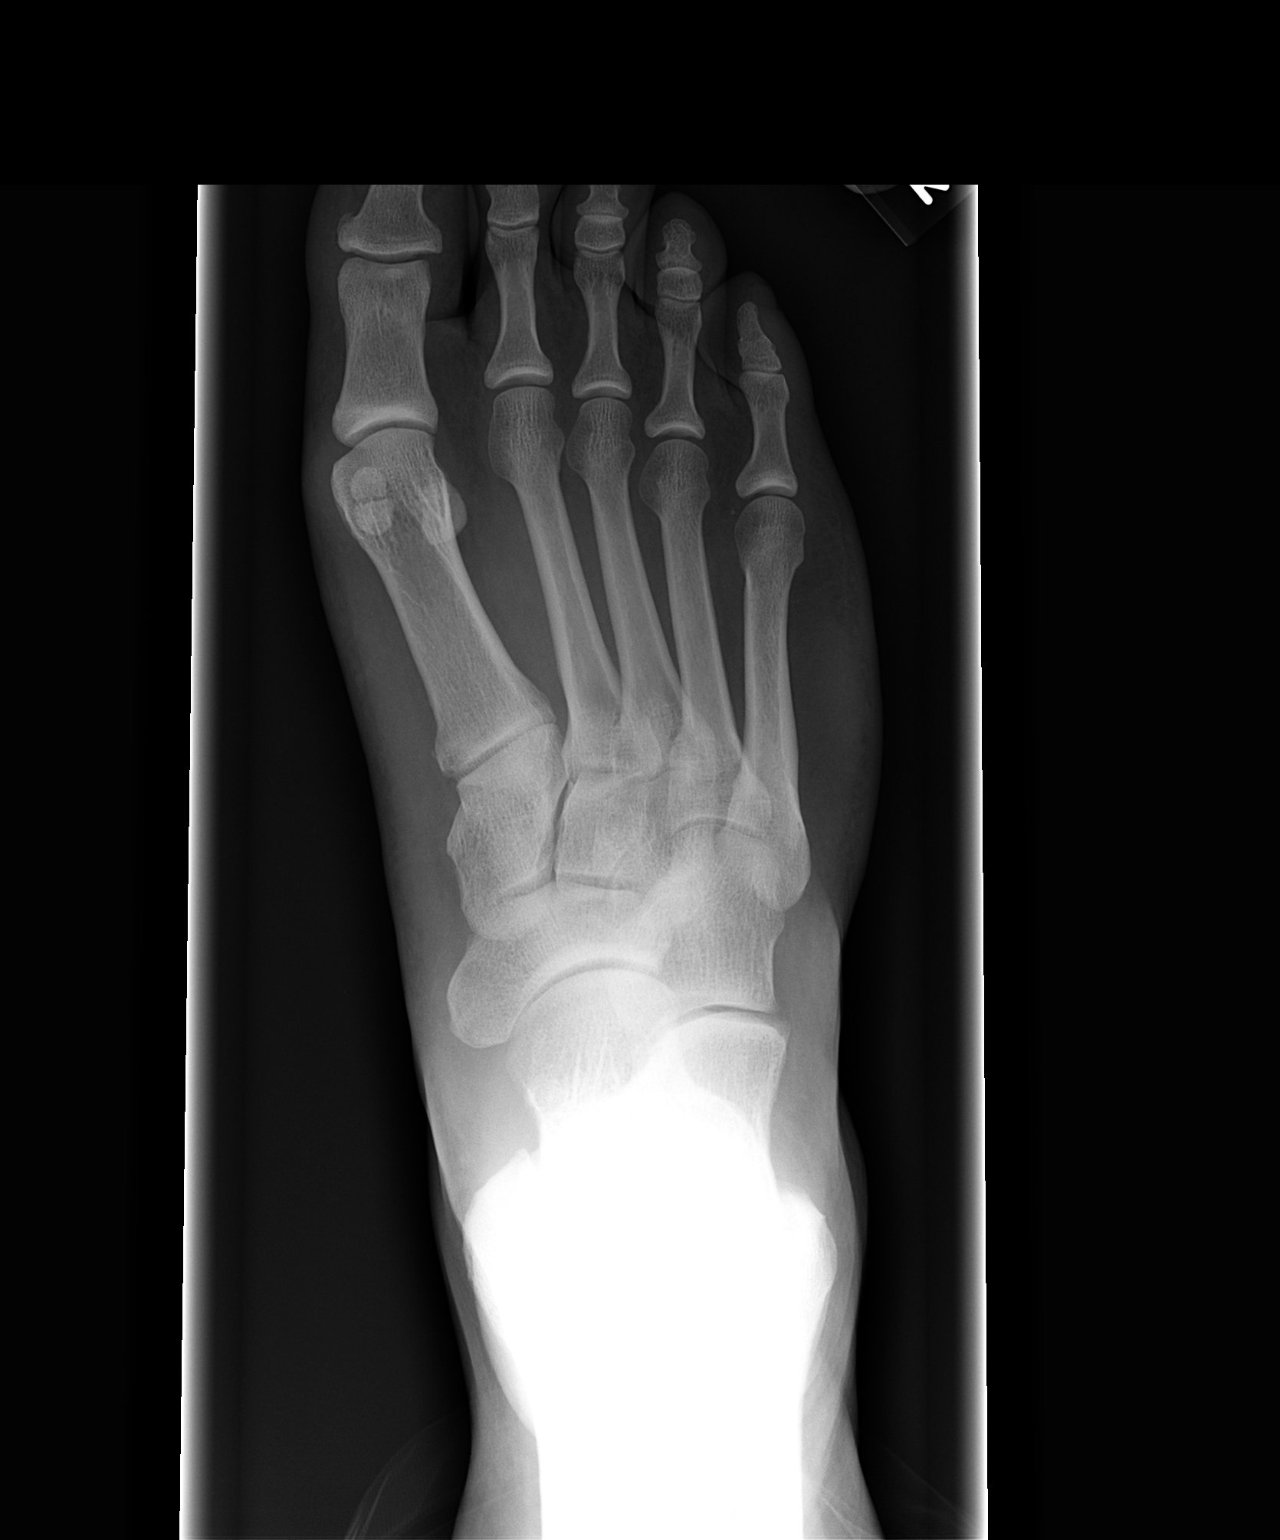

[view not recorded (2 of 3)]
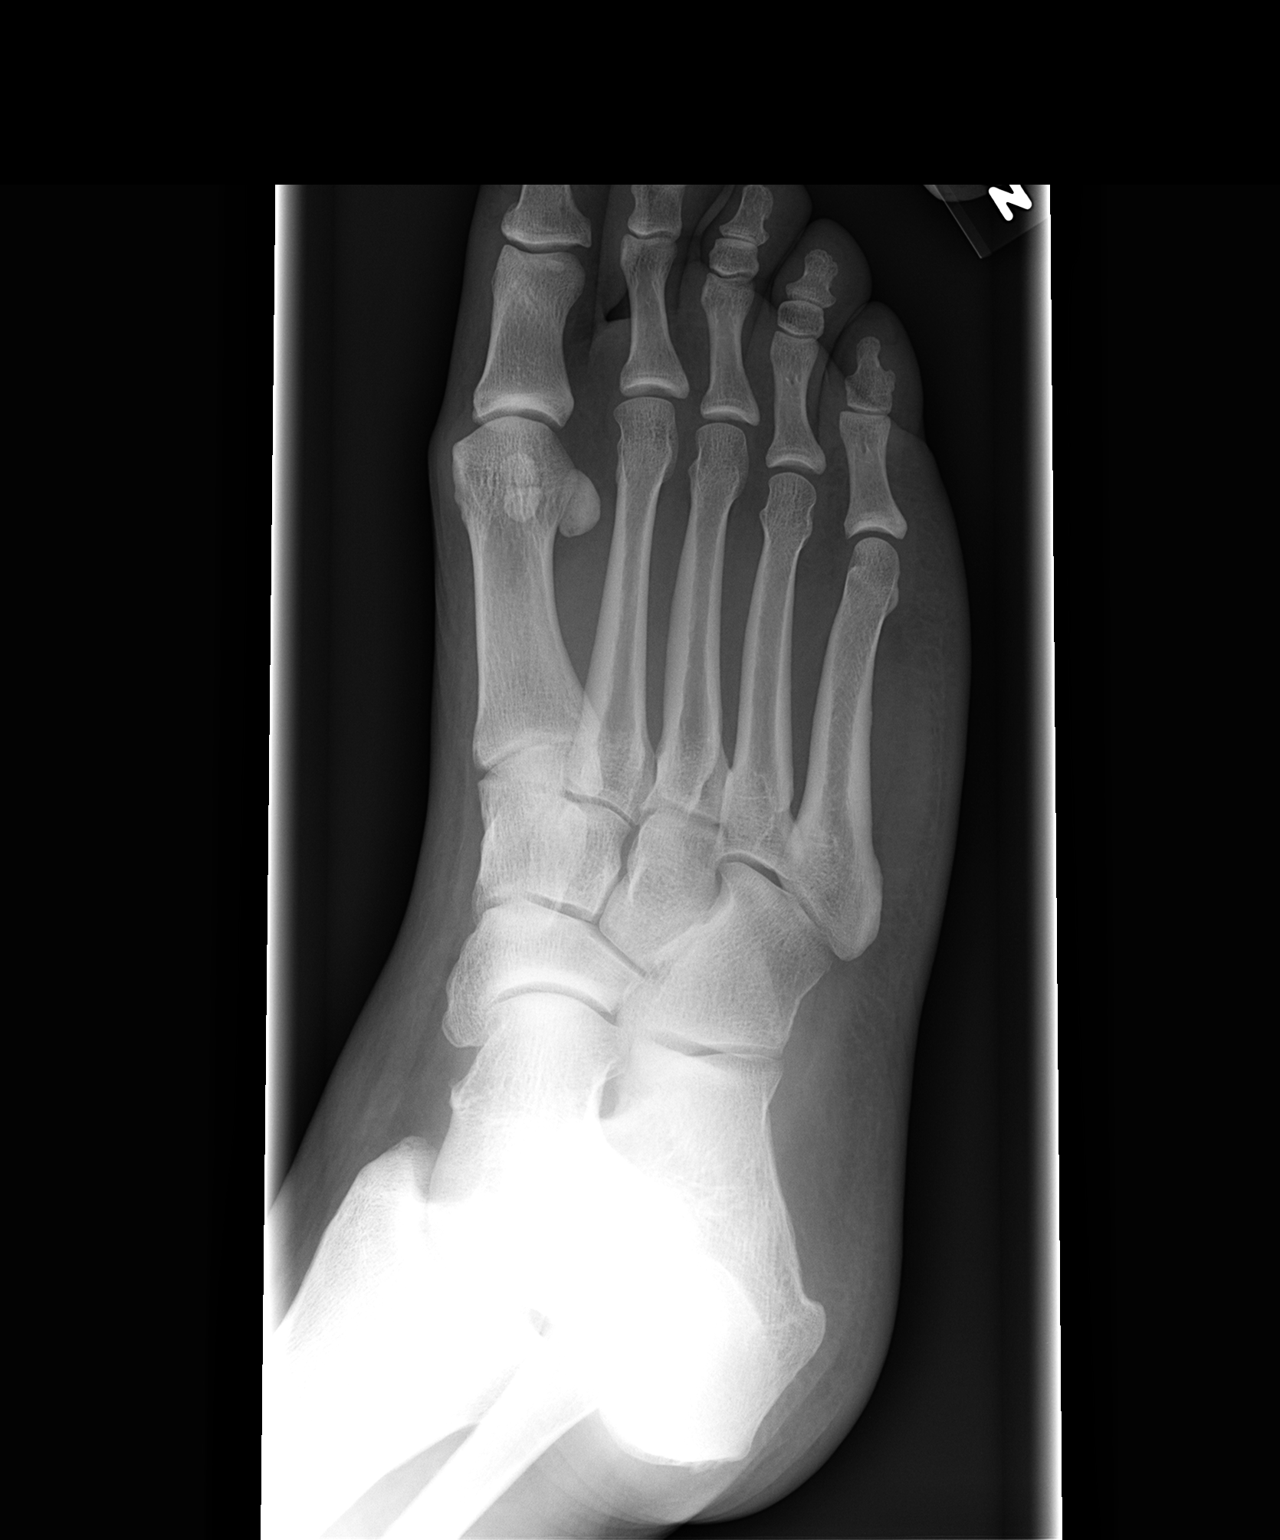

[view not recorded (3 of 3)]
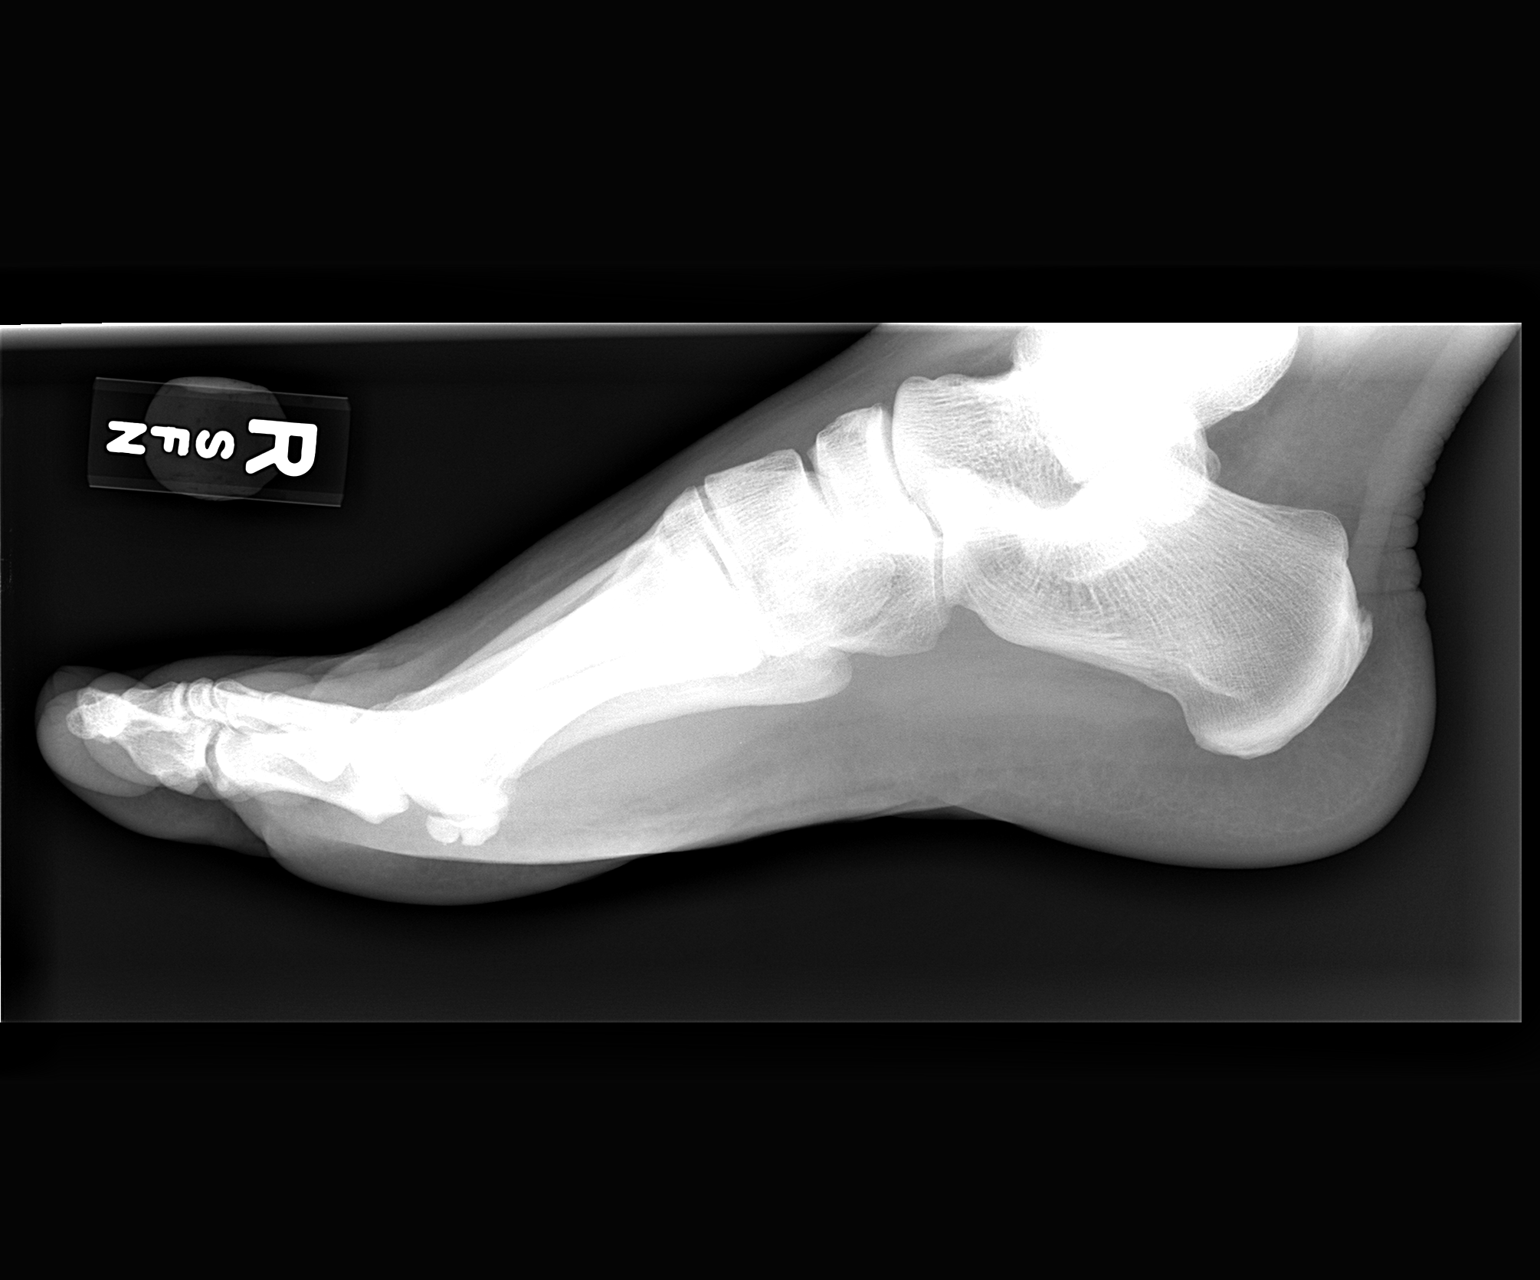

[3 of 3 positions shown; findings below may reference images not displayed]

FINDINGS: Frontal, oblique, and lateral views were obtained.  No
fracture or dislocation.  Joint spaces appear intact.  No erosive
change.
IMPRESSION: No abnormality noted.

## 2013-10-26 IMAGING — CR DG CHEST 2V
2 series · 2 of 2 positions shown · non-contrast
Comparison: 11/16/2011.

CLINICAL DATA: Shortness of breath.  History of asthma.

CHEST - 2 VIEW

[w chest pa]
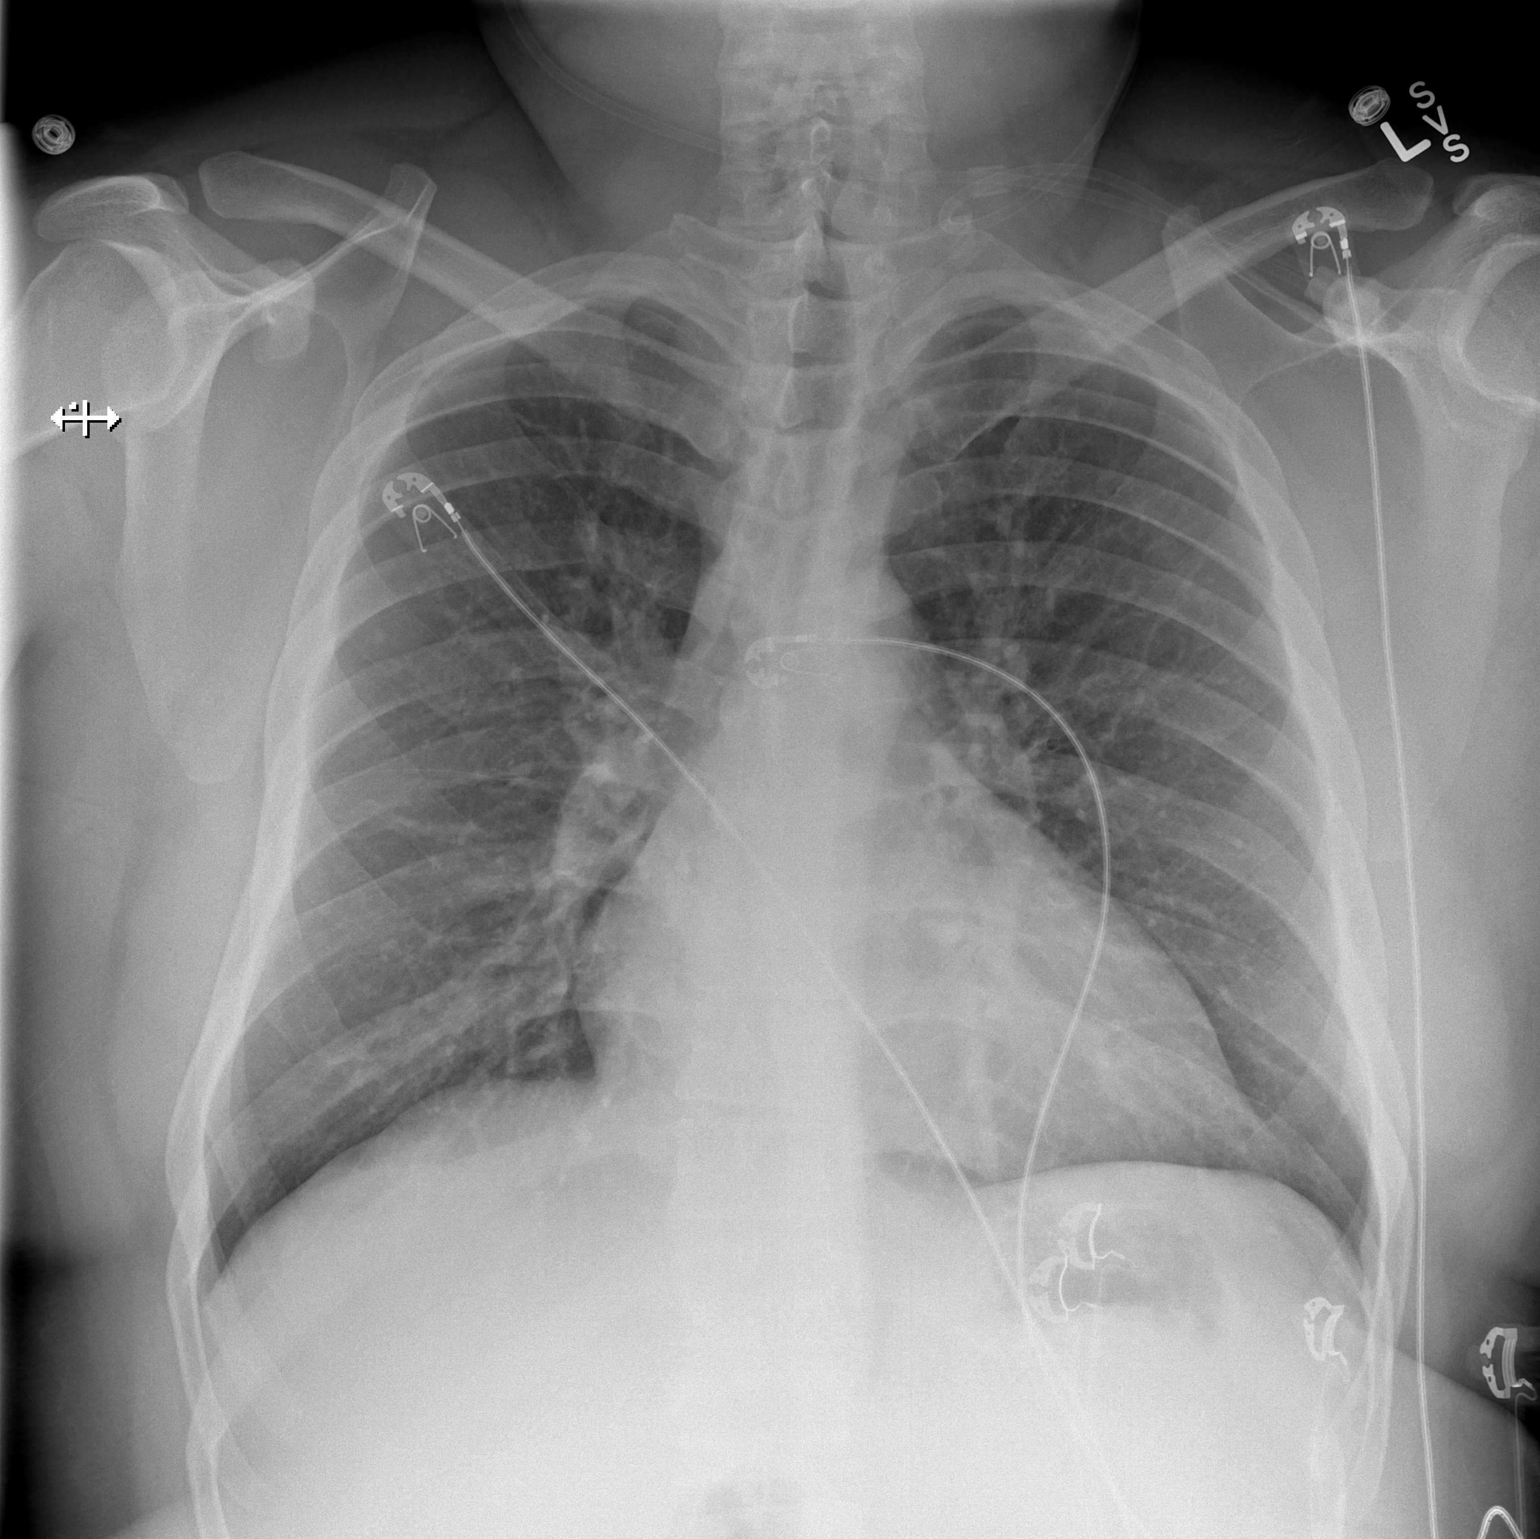

[w chest lat]
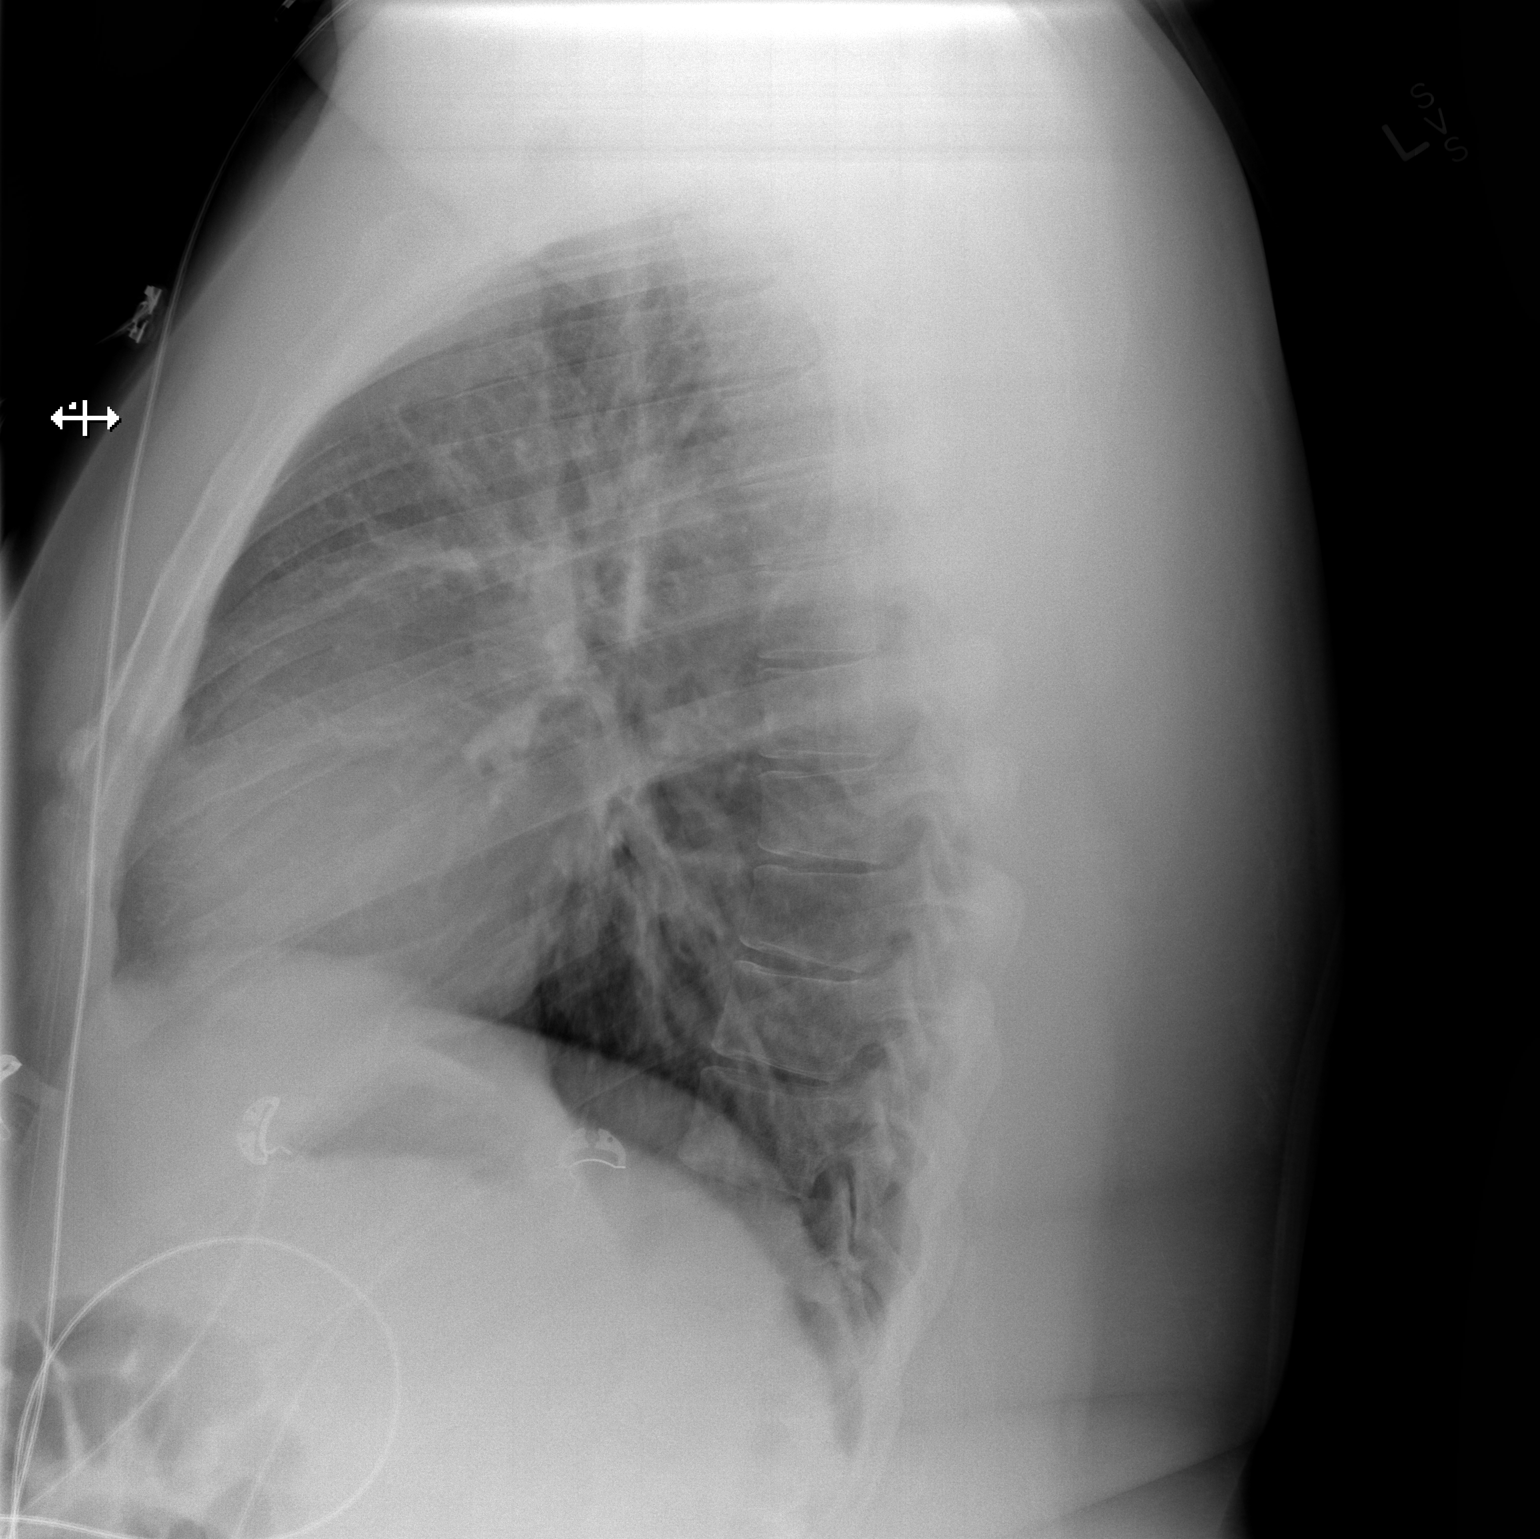

[2 of 2 positions shown; findings below may reference images not displayed]

FINDINGS: The cardiac silhouette is near the upper limit of normal
in size with a mild interval increase in size.  Clear lungs with
normal vascularity.  Unremarkable bones.
IMPRESSION: Interval heart size near the upper limit of normal.  Otherwise,
unremarkable examination.

## 2014-07-01 ENCOUNTER — Encounter (HOSPITAL_COMMUNITY): Payer: Self-pay | Admitting: Emergency Medicine

## 2014-07-01 ENCOUNTER — Emergency Department (HOSPITAL_COMMUNITY)
Admission: EM | Admit: 2014-07-01 | Discharge: 2014-07-01 | Disposition: A | Discharge status: Home / Self Care | Payer: Medicaid Other | Attending: Emergency Medicine | Admitting: Emergency Medicine

## 2014-07-01 ENCOUNTER — Emergency Department (HOSPITAL_COMMUNITY): Payer: Medicaid Other

## 2014-07-01 DIAGNOSIS — J45909 Unspecified asthma, uncomplicated: Secondary | ICD-10-CM | POA: Diagnosis not present

## 2014-07-01 DIAGNOSIS — R05 Cough: Secondary | ICD-10-CM | POA: Diagnosis present

## 2014-07-01 DIAGNOSIS — J159 Unspecified bacterial pneumonia: Secondary | ICD-10-CM | POA: Insufficient documentation

## 2014-07-01 DIAGNOSIS — Z7952 Long term (current) use of systemic steroids: Secondary | ICD-10-CM | POA: Insufficient documentation

## 2014-07-01 DIAGNOSIS — J189 Pneumonia, unspecified organism: Secondary | ICD-10-CM

## 2014-07-01 DIAGNOSIS — J40 Bronchitis, not specified as acute or chronic: Secondary | ICD-10-CM

## 2014-07-01 LAB — BASIC METABOLIC PANEL
Anion gap: 9 (ref 5–15)
BUN: 9 mg/dL (ref 6–23)
CO2: 23 mmol/L (ref 19–32)
Calcium: 8.5 mg/dL (ref 8.4–10.5)
Chloride: 103 mmol/L (ref 96–112)
Creatinine, Ser: 1.42 mg/dL — ABNORMAL HIGH (ref 0.50–1.35)
GFR calc Af Amer: 74 mL/min — ABNORMAL LOW (ref >90)
GFR calc non Af Amer: 64 mL/min — ABNORMAL LOW (ref >90)
Glucose, Bld: 98 mg/dL (ref 70–99)
Potassium: 4.1 mmol/L (ref 3.5–5.1)
Sodium: 135 mmol/L (ref 135–145)

## 2014-07-01 LAB — CBC WITH DIFFERENTIAL/PLATELET
Basophils Absolute: 0 10*3/uL (ref 0.0–0.1)
Basophils Relative: 0 % (ref 0–1)
Eosinophils Absolute: 0.2 10*3/uL (ref 0.0–0.7)
Eosinophils Relative: 2 % (ref 0–5)
HCT: 44 % (ref 39.0–52.0)
Hemoglobin: 15.3 g/dL (ref 13.0–17.0)
Lymphocytes Relative: 20 % (ref 12–46)
Lymphs Abs: 2.1 10*3/uL (ref 0.7–4.0)
MCH: 29.7 pg (ref 26.0–34.0)
MCHC: 34.8 g/dL (ref 30.0–36.0)
MCV: 85.3 fL (ref 78.0–100.0)
Monocytes Absolute: 0.8 10*3/uL (ref 0.1–1.0)
Monocytes Relative: 8 % (ref 3–12)
Neutro Abs: 7.1 10*3/uL (ref 1.7–7.7)
Neutrophils Relative %: 70 % (ref 43–77)
Platelets: 165 10*3/uL (ref 150–400)
RBC: 5.16 MIL/uL (ref 4.22–5.81)
RDW: 13.1 % (ref 11.5–15.5)
WBC: 10.1 10*3/uL (ref 4.0–10.5)

## 2014-07-01 LAB — URINALYSIS, ROUTINE W REFLEX MICROSCOPIC
Bilirubin Urine: NEGATIVE
Glucose, UA: NEGATIVE mg/dL
Hgb urine dipstick: NEGATIVE
Ketones, ur: NEGATIVE mg/dL
Leukocytes, UA: NEGATIVE
Nitrite: NEGATIVE
Protein, ur: 30 mg/dL — AB
Specific Gravity, Urine: 1.022 (ref 1.005–1.030)
Urobilinogen, UA: 0.2 mg/dL (ref 0.0–1.0)
pH: 8 (ref 5.0–8.0)

## 2014-07-01 LAB — URINE MICROSCOPIC-ADD ON

## 2014-07-01 MED ORDER — DOXYCYCLINE HYCLATE 100 MG PO CAPS: 100.0000 mg | ORAL_CAPSULE | Freq: Two times a day (BID) | ORAL | Status: DC

## 2014-07-01 MED ORDER — ALBUTEROL SULFATE HFA 108 (90 BASE) MCG/ACT IN AERS
2.0000 | INHALATION_SPRAY | RESPIRATORY_TRACT | Status: DC | PRN
  Administered 2014-07-01: 2 via RESPIRATORY_TRACT
  Filled 2014-07-01: qty 6.7

## 2014-07-01 MED ORDER — ALBUTEROL (5 MG/ML) CONTINUOUS INHALATION SOLN
10.0000 mg/h | INHALATION_SOLUTION | RESPIRATORY_TRACT | Status: DC
  Administered 2014-07-01 (×2): 10 mg/h via RESPIRATORY_TRACT
  Filled 2014-07-01 (×2): qty 20

## 2014-07-01 MED ORDER — GUAIFENESIN ER 1200 MG PO TB12: 1.0000 | ORAL_TABLET | Freq: Two times a day (BID) | ORAL | Status: DC

## 2014-07-01 MED ORDER — PREDNISONE 50 MG PO TABS: 50.0000 mg | ORAL_TABLET | Freq: Every day | ORAL | Status: DC

## 2014-07-01 MED ORDER — ACETAMINOPHEN-CODEINE 120-12 MG/5ML PO SOLN: 10.0000 mL | ORAL | Status: DC | PRN

## 2014-07-01 NOTE — Discharge Instructions (Signed)
Return here as needed.  Follow-up with the clinic provided.  Increase your fluid intake. °

## 2014-07-01 NOTE — ED Provider Notes (Signed)
CSN: [id number]     Arrival date & time 07/01/14  1103 History   First MD Initiated Contact with Patient 07/01/14 1132     Chief Complaint  Patient presents with  . Back Pain  . Cough     (Consider location/radiation/quality/duration/timing/severity/associated sxs/prior Treatment) HPI Patient presents to the emergency department with cough and left side pain.  The patient states that he noticed that he had left side pain that was worse with cough and movement.  The patient states that nothing seems make his condition better.  Patient tried his albuterol inhaler at home without resolution of his symptoms.  Patient states that he has not had any fever, weakness, dizziness, headache, blurred vision, abdominal pain, chest pain, dysuria, or syncope.  The patient states that he had not had any problems with his asthma until the last couple of days Past Medical History  Diagnosis Date  . Asthma    History reviewed. No pertinent past surgical history. No family history on file. History  Substance Use Topics  . Smoking status: Never Smoker   . Smokeless tobacco: Not on file  . Alcohol Use: Yes    Review of Systems All other systems negative except as documented in the HPI. All pertinent positives and negatives as reviewed in the HPI.    Allergies  Review of patient's allergies indicates no known allergies.  Home Medications   Prior to Admission medications   Medication Sig Start Date End Date Taking? Authorizing Provider  albuterol (PROVENTIL HFA;VENTOLIN HFA) 108 (90 BASE) MCG/ACT inhaler Inhale 2 puffs into the lungs every 4 (four) hours as needed. For shortness of breath/asthma symptoms    Historical Provider, MD  ibuprofen (ADVIL,MOTRIN) 600 MG tablet Take 1 tablet (600 mg total) by mouth every 6 (six) hours as needed for pain. 02/18/13   Derwood KaplanAnkit Nanavati, MD  naproxen (NAPROSYN) 375 MG tablet Take 375 mg by mouth 2 (two) times daily as needed (pain).    Historical Provider, MD   oxyCODONE-acetaminophen (PERCOCET) 10-325 MG per tablet Take 1 tablet by mouth every 4 (four) hours as needed for pain.    Historical Provider, MD  predniSONE (DELTASONE) 50 MG tablet Take 1 tablet (50 mg total) by mouth daily. 02/18/13   Ankit Nanavati, MD   BP 130/81 mmHg  Pulse 110  Temp(Src) 98.9 F (37.2 C) (Oral)  Resp 24  Ht 5\' 10"  (1.778 m)  Wt 302 lb (136.986 kg)  BMI 43.33 kg/m2  SpO2 96% Physical Exam  Constitutional: He is oriented to person, place, and time. He appears well-developed and well-nourished. No distress.  HENT:  Head: Normocephalic and atraumatic.  Mouth/Throat: Oropharynx is clear and moist.  Eyes: Pupils are equal, round, and reactive to light.  Neck: Normal range of motion. Neck supple.  Cardiovascular: Normal rate, regular rhythm and normal heart sounds.  Exam reveals no gallop and no friction rub.   No murmur heard. Pulmonary/Chest: Effort normal and breath sounds normal. No respiratory distress.  Musculoskeletal: He exhibits no edema.  Neurological: He is alert and oriented to person, place, and time. He exhibits normal muscle tone. Coordination normal.  Skin: Skin is warm and dry. No rash noted. No erythema.  Nursing note and vitals reviewed.   ED Course  Procedures (including critical care time) Labs Review Labs Reviewed  URINALYSIS, ROUTINE W REFLEX MICROSCOPIC - Abnormal; Notable for the following:    Protein, ur 30 (*)    All other components within normal limits  CBC WITH DIFFERENTIAL/PLATELET  URINE MICROSCOPIC-ADD ON  BASIC METABOLIC PANEL    Imaging Review Dg Chest 2 View  07/01/2014   CLINICAL DATA:  34 year old male with 1 day history of cough and throat pain. Wheezing.  EXAM: CHEST  2 VIEW  COMPARISON:  Chest x-ray 02/18/2013.  FINDINGS: Mild diffuse peribronchial cuffing. 9 mm ill-defined slightly nodular opacity in the left mid lung. No acute consolidation showed no pleural effusions. No evidence of pulmonary edema. Heart size is  normal. Upper mediastinal contours are within normal limits.  IMPRESSION: 1. Mild diffuse peribronchial cuffing suggestive of acute bronchitis. 2. Ill-defined subcentimeter nodular density seen in the left mid lung only on the frontal projection. This is nonspecific, and favored to be infectious or inflammatory given the apparent bronchitis. Repeat radiographs are recommended and 2-3 weeks to ensure resolution of this finding.   Electronically Signed   By: Trudie Reed M.D.   On: 07/01/2014 12:53   The patient will be treated for possible pneumonia and told to return here as needed. Told to increase his fluid intake.   MDM   Final diagnoses:  None      Carlyle Dolly, PA-C 07/01/14 1450  Arby Barrette, MD 07/03/14 [id number]

## 2014-07-01 NOTE — ED Notes (Signed)
Pt reports having left lower back pain onset last night, denies injury.  Admits to waking up this morning with a productive cough yellow in color- hx of asthma, reports home inhaler is not helping.  Pt speaking in full sentences, respirations e/u- no acute distress noted.

## 2015-04-30 ENCOUNTER — Emergency Department (HOSPITAL_COMMUNITY): Payer: Medicaid Other

## 2015-04-30 ENCOUNTER — Emergency Department (HOSPITAL_COMMUNITY)
Admission: EM | Admit: 2015-04-30 | Discharge: 2015-04-30 | Disposition: A | Discharge status: Home / Self Care | Payer: Medicaid Other | Attending: Emergency Medicine | Admitting: Emergency Medicine

## 2015-04-30 ENCOUNTER — Encounter (HOSPITAL_COMMUNITY): Payer: Self-pay | Admitting: Nurse Practitioner

## 2015-04-30 DIAGNOSIS — Z792 Long term (current) use of antibiotics: Secondary | ICD-10-CM | POA: Insufficient documentation

## 2015-04-30 DIAGNOSIS — J45901 Unspecified asthma with (acute) exacerbation: Secondary | ICD-10-CM | POA: Diagnosis not present

## 2015-04-30 DIAGNOSIS — Z79899 Other long term (current) drug therapy: Secondary | ICD-10-CM | POA: Insufficient documentation

## 2015-04-30 DIAGNOSIS — J40 Bronchitis, not specified as acute or chronic: Secondary | ICD-10-CM

## 2015-04-30 DIAGNOSIS — R079 Chest pain, unspecified: Secondary | ICD-10-CM | POA: Diagnosis present

## 2015-04-30 LAB — BASIC METABOLIC PANEL
Anion gap: 7 (ref 5–15)
BUN: 13 mg/dL (ref 6–20)
CO2: 25 mmol/L (ref 22–32)
Calcium: 8.4 mg/dL — ABNORMAL LOW (ref 8.9–10.3)
Chloride: 104 mmol/L (ref 101–111)
Creatinine, Ser: 1.19 mg/dL (ref 0.61–1.24)
GFR calc Af Amer: >60 mL/min (ref >60)
GFR calc non Af Amer: >60 mL/min (ref >60)
Glucose, Bld: 108 mg/dL — ABNORMAL HIGH (ref 65–99)
Potassium: 4.9 mmol/L (ref 3.5–5.1)
Sodium: 136 mmol/L (ref 135–145)

## 2015-04-30 MED ORDER — ALBUTEROL (5 MG/ML) CONTINUOUS INHALATION SOLN
10.0000 mg/h | INHALATION_SOLUTION | RESPIRATORY_TRACT | Status: AC
  Administered 2015-04-30: 10 mg/h via RESPIRATORY_TRACT
  Filled 2015-04-30: qty 20

## 2015-04-30 MED ORDER — PREDNISONE 50 MG PO TABS: 50.0000 mg | ORAL_TABLET | Freq: Every day | ORAL | Status: DC

## 2015-04-30 MED ORDER — METHYLPREDNISOLONE SODIUM SUCC 125 MG IJ SOLR
125.0000 mg | INTRAMUSCULAR | Status: AC
  Administered 2015-04-30: 125 mg via INTRAVENOUS
  Filled 2015-04-30: qty 2

## 2015-04-30 MED ORDER — ACETAMINOPHEN-CODEINE 120-12 MG/5ML PO SOLN: 10.0000 mL | ORAL | Status: DC | PRN

## 2015-04-30 MED ORDER — GUAIFENESIN ER 1200 MG PO TB12: 1.0000 | ORAL_TABLET | Freq: Two times a day (BID) | ORAL | Status: DC

## 2015-04-30 MED ORDER — IPRATROPIUM BROMIDE 0.02 % IN SOLN
0.5000 mg | Freq: Once | RESPIRATORY_TRACT | Status: AC
  Administered 2015-04-30: 0.5 mg via RESPIRATORY_TRACT
  Filled 2015-04-30: qty 2.5

## 2015-04-30 MED ORDER — AEROCHAMBER PLUS W/MASK MISC
1.0000 | Freq: Once | Status: AC
  Administered 2015-04-30: 1
  Filled 2015-04-30: qty 1

## 2015-04-30 NOTE — ED Notes (Signed)
I-stat troponin was cancelled by MD

## 2015-04-30 NOTE — Discharge Instructions (Signed)
Return here as needed.  Increase your fluid intake, rest as much as possible °

## 2015-04-30 NOTE — ED Notes (Signed)
Pt c/o chest pain that he describes as tightness with burning sensation, also states he has been wheezing, used his home nebulizer treatment without getting relief. Pt also reports cough with dizziness.

## 2015-04-30 NOTE — ED Notes (Signed)
EKG given to EDP,Miller,MD., for review. 

## 2015-04-30 NOTE — ED Provider Notes (Signed)
CSN: [id number]     Arrival date & time 04/30/15  1943 History   First MD Initiated Contact with Patient 04/30/15 1951     Chief Complaint  Patient presents with  . Chest Pain    HPI  Patient is a 34 y/o AAM with a PMH of asthma who presents today with chest tightness. 3-4 days ago he noticed a scratchy throat and cough productive of yellow sputum.  Cough associated with L lateral neck pain.  Today he woke up with increased chest tightness and wheezing.  He used albuterol "a lot" and his child's neb which did not provide him any relief.  Denies fever, rhinorrhea, otalgia, palpitations, hemoptysis.  He shares that he has had several episodes of asthma exacerbation in the past. He smokes marijuana daily, denies cigarette smoking.     Past Medical History  Diagnosis Date  . Asthma    History reviewed. No pertinent past surgical history. History reviewed. No pertinent family history. Social History  Substance Use Topics  . Smoking status: Never Smoker   . Smokeless tobacco: None  . Alcohol Use: Yes    Review of Systems All other systems negative except as documented in the HPI. All pertinent positives and negatives as reviewed in the HPI.    Allergies  Review of patient's allergies indicates no known allergies.  Home Medications   Prior to Admission medications   Medication Sig Start Date End Date Taking? Authorizing Provider  albuterol (PROVENTIL HFA;VENTOLIN HFA) 108 (90 BASE) MCG/ACT inhaler Inhale 2 puffs into the lungs every 4 (four) hours as needed. For shortness of breath/asthma symptoms   Yes Historical Provider, MD  Menthol (COUGH DROPS) 10 MG LOZG Use as directed 1 lozenge in the mouth or throat every 2 (two) hours as needed (sore throat).   Yes Historical Provider, MD  oxyCODONE-acetaminophen (PERCOCET) 10-325 MG per tablet Take 1 tablet by mouth every 4 (four) hours as needed for pain.   Yes Historical Provider, MD  acetaminophen-codeine 120-12 MG/5ML solution Take 10  mLs by mouth every 4 (four) hours as needed for moderate pain. 07/01/14   Charlestine Night, PA-C  doxycycline (VIBRAMYCIN) 100 MG capsule Take 1 capsule (100 mg total) by mouth 2 (two) times daily. 07/01/14   Lourdes Kucharski, PA-C  Guaifenesin 1200 MG TB12 Take 1 tablet (1,200 mg total) by mouth 2 (two) times daily. 07/01/14   Charlestine Night, PA-C  ibuprofen (ADVIL,MOTRIN) 600 MG tablet Take 1 tablet (600 mg total) by mouth every 6 (six) hours as needed for pain. 02/18/13   Derwood Kaplan, MD  naproxen (NAPROSYN) 375 MG tablet Take 375 mg by mouth 2 (two) times daily as needed (pain).    Historical Provider, MD  predniSONE (DELTASONE) 50 MG tablet Take 1 tablet (50 mg total) by mouth daily. 07/01/14   Saagar Tortorella, PA-C   BP 149/84 mmHg  Pulse 85  Temp(Src) 99.4 F (37.4 C) (Oral)  Resp 16  SpO2 99%   Physical Exam  Constitutional: He is oriented to person, place, and time. He appears well-developed and well-nourished. No distress.  HENT:  Head: Normocephalic and atraumatic.  Mouth/Throat: Oropharynx is clear and moist.  Eyes: Pupils are equal, round, and reactive to light.  Neck: Normal range of motion. Neck supple.  Cardiovascular: Normal rate, regular rhythm and normal heart sounds.   Pulmonary/Chest: Effort normal. No respiratory distress. He has wheezes.  Musculoskeletal: He exhibits no edema.  Neurological: He is alert and oriented to person, place, and time. He  exhibits normal muscle tone. Coordination normal.  Skin: Skin is warm and dry. No rash noted. No erythema.  Psychiatric: He has a normal mood and affect. His behavior is normal.  Nursing note and vitals reviewed.     ED Course  Procedures (including critical care time) Labs Review Labs Reviewed  BASIC METABOLIC PANEL - Abnormal; Notable for the following:    Glucose, Bld 108 (*)    Calcium 8.4 (*)    All other components within normal limits  CBC WITH DIFFERENTIAL/PLATELET  CBC WITH  DIFFERENTIAL/PLATELET    Imaging Review Dg Chest 2 View  04/30/2015  CLINICAL DATA:  Chest pain.  Wheezing.  History of asthma. EXAM: CHEST  2 VIEW COMPARISON:  07/01/2014 FINDINGS: Midline trachea.  Normal heart size and mediastinal contours. Sharp costophrenic angles.  No pneumothorax.  Clear lungs. IMPRESSION: Normal chest. Electronically Signed   By: Jeronimo GreavesKyle  Talbot M.D.   On: 04/30/2015 21:16   I have personally reviewed and evaluated these images and lab results as part of my medical decision-making.   EKG Interpretation   Date/Time:  Thursday April 30 2015 19:55:16 EST Ventricular Rate:  83 PR Interval:  145 QRS Duration: 83 QT Interval:  323 QTC Calculation: 379 R Axis:   93 Text Interpretation:  Sinus rhythm Borderline right axis deviation  Nonspecific T abnormalities, inferior leads T wave inversion now more  prominent in the lateral precordials Confirmed by MILLER  MD, BRIAN  [phone]([id number]) on 04/30/2015 7:58:29 PM      Patient is stable here in the emergency department feeling better following the hour-long neb treatments and steroids.  Patient is advised of the results and all questions were answered.  I advised him to return here for any worsening in his condition.  Patient agrees the plan and all questions were answered.  I feel this is an exacerbation of his asthma from an upper respiratory illness  Charlestine NightChristopher Catlin Doria, PA-C 05/01/15 0153  Lyndal Pulleyaniel Knott, MD 05/01/15 1356

## 2015-11-12 ENCOUNTER — Emergency Department (HOSPITAL_COMMUNITY)
Admission: EM | Admit: 2015-11-12 | Discharge: 2015-11-12 | Disposition: A | Discharge status: Home / Self Care | Payer: Medicaid Other | Attending: Emergency Medicine | Admitting: Emergency Medicine

## 2015-11-12 ENCOUNTER — Emergency Department (HOSPITAL_COMMUNITY): Payer: Medicaid Other

## 2015-11-12 ENCOUNTER — Encounter (HOSPITAL_COMMUNITY): Payer: Self-pay

## 2015-11-12 DIAGNOSIS — S99911A Unspecified injury of right ankle, initial encounter: Secondary | ICD-10-CM | POA: Diagnosis present

## 2015-11-12 DIAGNOSIS — Y929 Unspecified place or not applicable: Secondary | ICD-10-CM | POA: Insufficient documentation

## 2015-11-12 DIAGNOSIS — Y999 Unspecified external cause status: Secondary | ICD-10-CM | POA: Insufficient documentation

## 2015-11-12 DIAGNOSIS — Z7952 Long term (current) use of systemic steroids: Secondary | ICD-10-CM | POA: Diagnosis not present

## 2015-11-12 DIAGNOSIS — S93401A Sprain of unspecified ligament of right ankle, initial encounter: Secondary | ICD-10-CM | POA: Insufficient documentation

## 2015-11-12 DIAGNOSIS — F1290 Cannabis use, unspecified, uncomplicated: Secondary | ICD-10-CM | POA: Diagnosis not present

## 2015-11-12 DIAGNOSIS — J45909 Unspecified asthma, uncomplicated: Secondary | ICD-10-CM | POA: Diagnosis not present

## 2015-11-12 DIAGNOSIS — W109XXA Fall (on) (from) unspecified stairs and steps, initial encounter: Secondary | ICD-10-CM | POA: Insufficient documentation

## 2015-11-12 DIAGNOSIS — Z79899 Other long term (current) drug therapy: Secondary | ICD-10-CM | POA: Insufficient documentation

## 2015-11-12 DIAGNOSIS — Y939 Activity, unspecified: Secondary | ICD-10-CM | POA: Diagnosis not present

## 2015-11-12 NOTE — ED Notes (Signed)
Pt presents with c/o right ankle injury after falling off of the steps today. Ambulatory to triage.

## 2015-11-12 NOTE — ED Notes (Signed)
PT DISCHARGED. INSTRUCTIONS GIVEN. AAOX4. PT IN NO APPARENT DISTRESS. THE OPPORTUNITY TO ASK QUESTIONS WAS PROVIDED. 

## 2015-11-12 NOTE — ED Provider Notes (Signed)
CSN: [id number]     Arrival date & time 11/12/15  1748 History  By signing my name below, I, Phillis HaggisGabriella Gaje, attest that this documentation has been prepared under the direction and in the presence of Audry Piliyler Teyton Pattillo, PA-C. Electronically Signed: Phillis HaggisGabriella Gaje, ED Scribe. 11/12/2015. 7:03 PM.   Chief Complaint  Patient presents with  . Ankle Injury   The history is provided by the patient. No language interpreter was used.  HPI Comments: William Crane is a 35 y.o. male who presents to the Emergency Department complaining of a right ankle injury onset 6 hours ago. He reports associated swelling to the ankle. Pt reports that the most pain is to the lateral surface of the ankle. He rates his pain 7/10 and it is throbbing. Pt is able to ambulate, but states that it is painful. Pt reports hurting his ankle after falling down steps today, rolling his ankle. He has taken Aleve for his symptoms to no relief. He denies hitting head, LOC, color change, wound, numbness, or weakness.   Past Medical History  Diagnosis Date  . Asthma    History reviewed. No pertinent past surgical history. No family history on file. Social History  Substance Use Topics  . Smoking status: Never Smoker   . Smokeless tobacco: None  . Alcohol Use: Yes    Review of Systems  Musculoskeletal: Positive for joint swelling and arthralgias.  Skin: Negative for color change and wound.  Neurological: Negative for weakness and numbness.   Allergies  Review of patient's allergies indicates no known allergies.  Home Medications   Prior to Admission medications   Medication Sig Start Date End Date Taking? Authorizing Provider  acetaminophen-codeine 120-12 MG/5ML solution Take 10 mLs by mouth every 4 (four) hours as needed for moderate pain. 04/30/15   Charlestine Nighthristopher Lawyer, PA-C  albuterol (PROVENTIL HFA;VENTOLIN HFA) 108 (90 BASE) MCG/ACT inhaler Inhale 2 puffs into the lungs every 4 (four) hours as needed. For shortness of  breath/asthma symptoms    Historical Provider, MD  doxycycline (VIBRAMYCIN) 100 MG capsule Take 1 capsule (100 mg total) by mouth 2 (two) times daily. Patient not taking: Reported on 04/30/2015 07/01/14   Charlestine Nighthristopher Lawyer, PA-C  Guaifenesin 1200 MG TB12 Take 1 tablet (1,200 mg total) by mouth 2 (two) times daily. 04/30/15   Charlestine Nighthristopher Lawyer, PA-C  ibuprofen (ADVIL,MOTRIN) 600 MG tablet Take 1 tablet (600 mg total) by mouth every 6 (six) hours as needed for pain. Patient not taking: Reported on 04/30/2015 02/18/13   Derwood KaplanAnkit Nanavati, MD  Menthol (COUGH DROPS) 10 MG LOZG Use as directed 1 lozenge in the mouth or throat every 2 (two) hours as needed (sore throat).    Historical Provider, MD  naproxen (NAPROSYN) 375 MG tablet Take 375 mg by mouth 2 (two) times daily as needed (pain).    Historical Provider, MD  oxyCODONE-acetaminophen (PERCOCET) 10-325 MG per tablet Take 1 tablet by mouth every 4 (four) hours as needed for pain.    Historical Provider, MD  predniSONE (DELTASONE) 50 MG tablet Take 1 tablet (50 mg total) by mouth daily. 04/30/15   Christopher Lawyer, PA-C   BP 140/96 mmHg  Pulse 86  Temp(Src) 99.3 F (37.4 C) (Oral)  Resp 20  SpO2 98%   Physical Exam  Constitutional: He is oriented to person, place, and time. He appears well-developed and well-nourished.  HENT:  Head: Normocephalic and atraumatic.  Eyes: Conjunctivae and EOM are normal. Pupils are equal, round, and reactive to light.  Neck: Normal  range of motion. Neck supple.  Musculoskeletal: Normal range of motion. He exhibits tenderness.  Pain with plantarflexion and dorsiflexion of the right ankle; NVI; tenderness to the lateral malleolus of the right ankle  Neurological: He is alert and oriented to person, place, and time.  Skin: Skin is warm and dry. No rash noted.  Psychiatric: He has a normal mood and affect. His behavior is normal.  Nursing note and vitals reviewed.  ED Course  Procedures (including critical care  time) DIAGNOSTIC STUDIES: Oxygen Saturation is 98% on RA, normal by my interpretation.    COORDINATION OF CARE: 7:01 PM-Discussed treatment plan which includes x-ray with pt at bedside and pt agreed to plan.   Labs Review Labs Reviewed - No data to display  Imaging Review Dg Ankle Complete Right  11/12/2015  CLINICAL DATA:  Fall down steps with pain EXAM: RIGHT ANKLE - COMPLETE 3+ VIEW COMPARISON:  None. FINDINGS: Frontal, oblique, and lateral views were obtained. There is mild generalized soft tissue swelling. There is no fracture or joint effusion. The ankle mortise appears intact. There is no appreciable joint space narrowing. There is a minimal posterior calcaneal spur. IMPRESSION: Soft tissue swelling. No fracture. Ankle mortise appears intact. Minimal posterior calcaneal spur. Electronically Signed   By: Bretta BangWilliam  Woodruff III M.D.   On: 11/12/2015 20:21   I have personally reviewed and evaluated these images as part of my medical decision-making.   EKG Interpretation None      MDM  I have reviewed and evaluated the relevant imaging studies.  I have reviewed the relevant previous healthcare records. I obtained HPI from historian.  ED Course:  Assessment:  Patient X-Ray negative for obvious fracture or dislocation. Pain managed in ED. Likely ankle sprain. Pt advised to follow up with PCP if symptoms persist. Patient given ASO brace while in ED, conservative therapy recommended and discussed. Patient will be dc home & is agreeable with above plan.  Disposition/Plan:  DC Home Additional Verbal discharge instructions given and discussed with patient.  Pt Instructed to f/u with PCP in the next week for evaluation and treatment of symptoms. Return precautions given Pt acknowledges and agrees with plan  Supervising Physician Tilden FossaElizabeth Rees, MD   Final diagnoses:  Ankle sprain, right, initial encounter   I personally performed the services described in this documentation, which  was scribed in my presence. The recorded information has been reviewed and is accurate.   Audry Piliyler Rema Lievanos, PA-C 11/12/15 2028  Tilden FossaElizabeth Rees, MD 11/14/15 [phone]

## 2015-11-12 NOTE — Discharge Instructions (Signed)
Please read and follow all provided instructions.  Your diagnoses today include:  1. Ankle sprain, right, initial encounter    Tests performed today include:  Vital signs. See below for your results today.   Medications prescribed:   Take as prescribed   Home care instructions:  Follow any educational materials contained in this packet.  Follow-up instructions: Please follow-up with your primary care provider for further evaluation of symptoms and treatment   Return instructions:   Please return to the Emergency Department if you do not get better, if you get worse, or new symptoms OR  - Fever (temperature greater than 101.7F)  - Bleeding that does not stop with holding pressure to the area    -Severe pain (please note that you may be more sore the day after your accident)  - Chest Pain  - Difficulty breathing  - Severe nausea or vomiting  - Inability to tolerate food and liquids  - Passing out  - Skin becoming red around your wounds  - Change in mental status (confusion or lethargy)  - New numbness or weakness     Please return if you have any other emergent concerns.  Additional Information:  Your vital signs today were: BP 140/96 mmHg   Pulse 86   Temp(Src) 99.3 F (37.4 C) (Oral)   Resp 20   SpO2 98% If your blood pressure (BP) was elevated above 135/85 this visit, please have this repeated by your doctor within one month. ---------------

## 2016-01-28 ENCOUNTER — Encounter (HOSPITAL_COMMUNITY): Payer: Self-pay | Admitting: Emergency Medicine

## 2016-01-28 ENCOUNTER — Emergency Department (HOSPITAL_COMMUNITY)
Admission: EM | Admit: 2016-01-28 | Discharge: 2016-01-28 | Disposition: A | Discharge status: Home / Self Care | Payer: Medicaid Other | Attending: Emergency Medicine | Admitting: Emergency Medicine

## 2016-01-28 DIAGNOSIS — J45909 Unspecified asthma, uncomplicated: Secondary | ICD-10-CM | POA: Insufficient documentation

## 2016-01-28 DIAGNOSIS — M79675 Pain in left toe(s): Secondary | ICD-10-CM | POA: Insufficient documentation

## 2016-01-28 DIAGNOSIS — Z79899 Other long term (current) drug therapy: Secondary | ICD-10-CM | POA: Insufficient documentation

## 2016-01-28 DIAGNOSIS — Z7951 Long term (current) use of inhaled steroids: Secondary | ICD-10-CM | POA: Diagnosis not present

## 2016-01-28 MED ORDER — INDOMETHACIN 50 MG PO CAPS: 50.0000 mg | ORAL_CAPSULE | Freq: Two times a day (BID) | ORAL | 0 refills | Status: DC

## 2016-01-28 NOTE — ED Notes (Signed)
PA at bedside.

## 2016-01-28 NOTE — Progress Notes (Signed)
Left great toe pain. No injury.No reddness or warmth to the touch.

## 2016-01-28 NOTE — Discharge Instructions (Signed)
Take the indomethacin as prescribed and be sure to eat when you take this medication as it can be hernia or stomach. Try to avoid alcohol and high-protein foods. Follow-up with the [facility] in 3 days to be reevaluated and to discuss long-term treatment for gout. You're also found to have elevated apart pressure today, please discuss this with your primary care provider.  Return to emergency department if your pain does not improve, your pain worsens, if you experience increased swelling, redness, warmth to your toe, you lose sensation at the tip of your toe, fever or any other concerning symptoms.

## 2016-01-28 NOTE — ED Provider Notes (Signed)
[facility]-EMERGENCY DEPT Provider Note   CSN: [id number] Arrival date & time: 01/28/16  1624   By signing my name below, I, Christel MormonMatthew Jamison, attest that this documentation has been prepared under the direction and in the presence of Kenard Morawski L. Tiffeny Minchew, PA-C. Electronically Signed: Christel MormonMatthew Jamison, Scribe. 01/28/2016. 8:47 PM.   History   Chief Complaint Chief Complaint  Patient presents with  . Toe Pain    The history is provided by the patient. No language interpreter was used.   HPI Comments:  William Crane is a 35 y.o. male who presents to the Emergency Department complaining of constant, Nonradiating L large toe pain onset this morning. Pt states that he woke up with toe pain in the middle of the night and when we woke up again the pain was "throbbing." Pt states that he frequently eats steak, shrimp and drinks liquor nightly. Pt has not PMHx of gout but he has pertinent FHx of gout. Pt has taken tylenol without relief. Pt denies injury/trauma, fever, no pain, nausea, vomiting..   Past Medical History:  Diagnosis Date  . Asthma     There are no active problems to display for this patient.   History reviewed. No pertinent surgical history.     Home Medications    Prior to Admission medications   Medication Sig Start Date End Date Taking? Authorizing Provider  acetaminophen-codeine 120-12 MG/5ML solution Take 10 mLs by mouth every 4 (four) hours as needed for moderate pain. 04/30/15   Charlestine Nighthristopher Lawyer, PA-C  albuterol (PROVENTIL HFA;VENTOLIN HFA) 108 (90 BASE) MCG/ACT inhaler Inhale 2 puffs into the lungs every 4 (four) hours as needed. For shortness of breath/asthma symptoms    Historical Provider, MD  doxycycline (VIBRAMYCIN) 100 MG capsule Take 1 capsule (100 mg total) by mouth 2 (two) times daily. Patient not taking: Reported on 04/30/2015 07/01/14   Charlestine Nighthristopher Lawyer, PA-C  Guaifenesin 1200 MG TB12 Take 1 tablet (1,200 mg total) by mouth 2 (two) times daily. 04/30/15    Charlestine Nighthristopher Lawyer, PA-C  ibuprofen (ADVIL,MOTRIN) 600 MG tablet Take 1 tablet (600 mg total) by mouth every 6 (six) hours as needed for pain. Patient not taking: Reported on 04/30/2015 02/18/13   Derwood KaplanAnkit Nanavati, MD  indomethacin (INDOCIN) 50 MG capsule Take 1 capsule (50 mg total) by mouth 2 (two) times daily with a meal. 01/28/16   Jerre SimonJessica L Zebedee Segundo, PA  Menthol (COUGH DROPS) 10 MG LOZG Use as directed 1 lozenge in the mouth or throat every 2 (two) hours as needed (sore throat).    Historical Provider, MD  naproxen (NAPROSYN) 375 MG tablet Take 375 mg by mouth 2 (two) times daily as needed (pain).    Historical Provider, MD  oxyCODONE-acetaminophen (PERCOCET) 10-325 MG per tablet Take 1 tablet by mouth every 4 (four) hours as needed for pain.    Historical Provider, MD  predniSONE (DELTASONE) 50 MG tablet Take 1 tablet (50 mg total) by mouth daily. 04/30/15   Charlestine Nighthristopher Lawyer, PA-C    Family History No family history on file.  Social History Social History  Substance Use Topics  . Smoking status: Never Smoker  . Smokeless tobacco: Never Used  . Alcohol use Yes     Allergies   Review of patient's allergies indicates no known allergies.   Review of Systems Review of Systems  Constitutional: Negative for fever.  Gastrointestinal: Negative for nausea and vomiting.  Musculoskeletal: Positive for arthralgias and joint swelling.  Skin: Positive for color change. Negative for rash and wound.  All other systems reviewed and are negative.    Physical Exam Updated Vital Signs BP 149/91 (BP Location: Right Arm)   Pulse 92   Temp 98.3 F (36.8 C) (Oral)   Resp 18   SpO2 100%   Physical Exam  Constitutional: He appears well-developed and well-nourished. No distress.  HENT:  Head: Normocephalic and atraumatic.  Eyes: Conjunctivae are normal.  Pulmonary/Chest: Effort normal. No respiratory distress.  Musculoskeletal: Normal range of motion. He exhibits tenderness.  Examination of  left great toe revealed redness, warmth, mild erythema and exquisite tenderness at the MTP joint, full range of motion, brisk capillary refill, sensation intact, Neurovascularly intact distally.   Neurological: He is alert. Coordination normal.  Skin: Skin is warm and dry. Capillary refill takes less than 2 seconds. He is not diaphoretic.  Psychiatric: He has a normal mood and affect. His behavior is normal.  Nursing note and vitals reviewed.    ED Treatments / Results  DIAGNOSTIC STUDIES:  Oxygen Saturation is 100% on RA, normal by my interpretation.    COORDINATION OF CARE:  8:47 PM Discussed treatment plan with pt at bedside and pt agreed to plan.   Labs (all labs ordered are listed, but only abnormal results are displayed) Labs Reviewed - No data to display  EKG  EKG Interpretation None       Radiology No results found.  Procedures Procedures (including critical care time)  Medications Ordered in ED Medications - No data to display   Initial Impression / Assessment and Plan / ED Course  I have reviewed the triage vital signs and the nursing notes.  Pertinent labs & imaging results that were available during my care of the patient were reviewed by me and considered in my medical decision making (see chart for details).  Clinical Course    Patient with left great toe pain. Presentation concerning for gout. Patient with a history of alcohol use every day and high-protein diet. Patient fevers, denies trauma, no wound, less concerning for septic joint. Patient has a family history of gout. Patient is neurovascularly intact distally and able to move toe although states it is painful. Patient will be discharged with indomethacin. Instructed patient to follow up with their primary care provider within 2-3 days to have toe reevaluated. Discussed strict return precautions to the ED. patient appears reliable and expressed understanding to the discharge instructions.   I  personally performed the services described in this documentation, which was scribed in my presence. The recorded information has been reviewed and is accurate.   Final Clinical Impressions(s) / ED Diagnoses   Final diagnoses:  Great toe pain, left    New Prescriptions Discharge Medication List as of 01/28/2016  8:57 PM    START taking these medications   Details  indomethacin (INDOCIN) 50 MG capsule Take 1 capsule (50 mg total) by mouth 2 (two) times daily with a meal., Starting Thu 01/28/2016, Print         Joyce Copa Mitchell, [place] 01/28/16 2123    Jacalyn Lefevre, MD 01/28/16 2355

## 2016-01-28 NOTE — ED Triage Notes (Signed)
Per pt, states he woke up with left large toe pain

## 2016-02-08 ENCOUNTER — Ambulatory Visit: Payer: Medicaid Other | Attending: Internal Medicine | Admitting: Physician Assistant

## 2016-02-08 VITALS — BP 135/82 | HR 74 | Temp 98.7°F | Resp 20 | Ht 69.0 in | Wt 308.0 lb

## 2016-02-08 DIAGNOSIS — Z23 Encounter for immunization: Secondary | ICD-10-CM | POA: Diagnosis not present

## 2016-02-08 DIAGNOSIS — M109 Gout, unspecified: Secondary | ICD-10-CM | POA: Diagnosis not present

## 2016-02-08 DIAGNOSIS — J4540 Moderate persistent asthma, uncomplicated: Secondary | ICD-10-CM

## 2016-02-08 MED ORDER — ALBUTEROL SULFATE HFA 108 (90 BASE) MCG/ACT IN AERS
2.0000 | INHALATION_SPRAY | Freq: Four times a day (QID) | RESPIRATORY_TRACT | 1 refills | Status: DC | PRN
Start: 2016-02-08 — End: 2016-04-25

## 2016-02-08 NOTE — Progress Notes (Signed)
Left large toe pain s/p ED visit 01/28/2016. Given indomethacin. Smokes Marijuana. History of Asthma wants Ventolin x 2; Proair.
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
## 2016-04-25 ENCOUNTER — Telehealth: Payer: Self-pay | Admitting: Internal Medicine

## 2016-04-25 MED ORDER — ALBUTEROL SULFATE HFA 108 (90 BASE) MCG/ACT IN AERS: 2.0000 | INHALATION_SPRAY | Freq: Four times a day (QID) | RESPIRATORY_TRACT | 1 refills | Status: DC | PRN

## 2016-04-25 MED FILL — PROAIR HFA 90 MCG INHALER: 108 (90 BAS | 20 days supply | Qty: 9 | Fill #0

## 2016-04-25 NOTE — Telephone Encounter (Signed)
I called patient and informed him that rx is ready to be called.

## 2016-04-25 NOTE — Telephone Encounter (Signed)
Patient called the office to request medication refill for albuterol (PROVENTIL HFA;VENTOLIN HFA) 108 (90 Base) MCG/ACT inhaler. Patient is scheduled next week with [facility] (to establish care) but needs rx before then. Please call it in to our pharmacy.  Thank you.

## 2016-04-25 NOTE — Telephone Encounter (Signed)
Refilled for patient.

## 2016-05-03 ENCOUNTER — Ambulatory Visit: Payer: Medicaid Other | Admitting: Family Medicine

## 2016-05-27 ENCOUNTER — Emergency Department (HOSPITAL_COMMUNITY): Payer: Medicaid Other

## 2016-05-27 ENCOUNTER — Encounter (HOSPITAL_COMMUNITY): Payer: Self-pay

## 2016-05-27 DIAGNOSIS — J45901 Unspecified asthma with (acute) exacerbation: Secondary | ICD-10-CM | POA: Diagnosis not present

## 2016-05-27 DIAGNOSIS — Z79899 Other long term (current) drug therapy: Secondary | ICD-10-CM | POA: Insufficient documentation

## 2016-05-27 LAB — CBC
HEMATOCRIT: 45.3 % (ref 39.0–52.0)
HEMOGLOBIN: 15.9 g/dL (ref 13.0–17.0)
MCH: 30.1 pg (ref 26.0–34.0)
MCHC: 35.1 g/dL (ref 30.0–36.0)
MCV: 85.6 fL (ref 78.0–100.0)
PLATELETS: 172 10*3/uL (ref 150–400)
RBC: 5.29 MIL/uL (ref 4.22–5.81)
RDW: 13.4 % (ref 11.5–15.5)
WBC: 5.9 10*3/uL (ref 4.0–10.5)

## 2016-05-27 LAB — I-STAT TROPONIN, ED: Troponin i, poc: 0 ng/mL (ref 0.00–0.08)

## 2016-05-27 MED ORDER — ALBUTEROL SULFATE (2.5 MG/3ML) 0.083% IN NEBU
INHALATION_SOLUTION | RESPIRATORY_TRACT | Status: AC
  Filled 2016-05-27: qty 6

## 2016-05-27 MED ORDER — ALBUTEROL SULFATE (2.5 MG/3ML) 0.083% IN NEBU
5.0000 mg | INHALATION_SOLUTION | Freq: Once | RESPIRATORY_TRACT | Status: AC
  Administered 2016-05-27: 5 mg via RESPIRATORY_TRACT
  Administered 2016-05-27: 2.5 mg via RESPIRATORY_TRACT

## 2016-05-27 MED ORDER — ALBUTEROL SULFATE (2.5 MG/3ML) 0.083% IN NEBU
5.0000 mg | INHALATION_SOLUTION | Freq: Once | RESPIRATORY_TRACT | Status: AC
  Administered 2016-05-27: 5 mg via RESPIRATORY_TRACT

## 2016-05-27 MED ORDER — IPRATROPIUM-ALBUTEROL 0.5-2.5 (3) MG/3ML IN SOLN
RESPIRATORY_TRACT | Status: AC
  Administered 2016-05-27: 3 mL via RESPIRATORY_TRACT
  Filled 2016-05-27: qty 3

## 2016-05-27 MED ORDER — ALBUTEROL SULFATE (2.5 MG/3ML) 0.083% IN NEBU
INHALATION_SOLUTION | RESPIRATORY_TRACT | Status: AC
  Administered 2016-05-27: 2.5 mg via RESPIRATORY_TRACT
  Filled 2016-05-27: qty 6

## 2016-05-27 MED ORDER — ALBUTEROL SULFATE (2.5 MG/3ML) 0.083% IN NEBU
INHALATION_SOLUTION | RESPIRATORY_TRACT | Status: DC
Start: 2016-05-27 — End: 2016-05-28
  Filled 2016-05-27: qty 6
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
## 2016-05-27 NOTE — ED Notes (Signed)
Patient with wheezing in all fields, breath sounds tight.  Patient stating he was having some shortness of breath increased with chest pain.  EKG done and patient given another breathing treatment.

## 2016-05-27 NOTE — ED Notes (Signed)
Pt reports the breathing txt did not work and he does not feel any better.

## 2016-05-27 NOTE — ED Notes (Signed)
Pt reporting chest pain.

## 2016-05-27 NOTE — ED Notes (Addendum)
Pt reports his breathing treatment spilled on the floor and did not get any of it.

## 2016-05-28 ENCOUNTER — Emergency Department (HOSPITAL_COMMUNITY)
Admission: EM | Admit: 2016-05-28 | Discharge: 2016-05-28 | Disposition: A | Discharge status: Home / Self Care | Payer: Medicaid Other | Attending: Emergency Medicine | Admitting: Emergency Medicine

## 2016-05-28 DIAGNOSIS — J45901 Unspecified asthma with (acute) exacerbation: Secondary | ICD-10-CM

## 2016-05-28 LAB — BASIC METABOLIC PANEL
ANION GAP: 11 (ref 5–15)
BUN: 13 mg/dL (ref 6–20)
CALCIUM: 9 mg/dL (ref 8.9–10.3)
CHLORIDE: 101 mmol/L (ref 101–111)
CO2: 25 mmol/L (ref 22–32)
Creatinine, Ser: 1.23 mg/dL (ref 0.61–1.24)
GFR calc non Af Amer: >60 mL/min (ref >60)
Glucose, Bld: 98 mg/dL (ref 65–99)
Potassium: 3.7 mmol/L (ref 3.5–5.1)
Sodium: 137 mmol/L (ref 135–145)

## 2016-05-28 MED ORDER — OXYMETAZOLINE HCL 0.05 % NA SOLN
1.0000 | Freq: Two times a day (BID) | NASAL | Status: DC
  Administered 2016-05-28: 1 via NASAL
  Filled 2016-05-28: qty 15

## 2016-05-28 MED ORDER — MAGNESIUM SULFATE 2 GM/50ML IV SOLN
2.0000 g | Freq: Once | INTRAVENOUS | Status: AC
  Administered 2016-05-28: 2 g via INTRAVENOUS
  Filled 2016-05-28: qty 50

## 2016-05-28 MED ORDER — PREDNISONE 20 MG PO TABS
60.0000 mg | ORAL_TABLET | Freq: Once | ORAL | Status: AC
Start: 2016-05-28 — End: 2016-05-28
  Administered 2016-05-28: 60 mg via ORAL
  Filled 2016-05-28: qty 3

## 2016-05-28 MED ORDER — PREDNISONE 20 MG PO TABS: ORAL_TABLET | ORAL | 0 refills | Status: DC

## 2016-05-28 MED ORDER — IPRATROPIUM BROMIDE 0.02 % IN SOLN
1.0000 mg | Freq: Once | RESPIRATORY_TRACT | Status: AC
  Administered 2016-05-28: 1 mg via RESPIRATORY_TRACT
  Filled 2016-05-28: qty 5

## 2016-05-28 MED ORDER — ALBUTEROL (5 MG/ML) CONTINUOUS INHALATION SOLN
10.0000 mg/h | INHALATION_SOLUTION | Freq: Once | RESPIRATORY_TRACT | Status: AC
  Administered 2016-05-28: 10 mg/h via RESPIRATORY_TRACT
  Filled 2016-05-28: qty 20

## 2016-05-28 MED ORDER — ALBUTEROL SULFATE HFA 108 (90 BASE) MCG/ACT IN AERS
2.0000 | INHALATION_SPRAY | Freq: Once | RESPIRATORY_TRACT | Status: AC
  Administered 2016-05-28: 2 via RESPIRATORY_TRACT
  Filled 2016-05-28: qty 6.7

## 2016-05-28 MED ORDER — ALBUTEROL SULFATE (2.5 MG/3ML) 0.083% IN NEBU
INHALATION_SOLUTION | RESPIRATORY_TRACT | Status: AC
  Administered 2016-05-28: 5 mg
  Filled 2016-05-28: qty 6

## 2016-05-28 MED ORDER — IPRATROPIUM-ALBUTEROL 0.5-2.5 (3) MG/3ML IN SOLN
3.0000 mL | RESPIRATORY_TRACT | Status: AC
  Administered 2016-05-27: 3 mL via RESPIRATORY_TRACT

## 2016-05-28 MED ORDER — AEROCHAMBER PLUS W/MASK MISC
1.0000 | Freq: Once | Status: AC
  Administered 2016-05-28: 1
  Filled 2016-05-28: qty 1

## 2016-05-28 NOTE — ED Notes (Signed)
Patient Alert and oriented X4. Stable and ambulatory. Patient verbalized understanding of the discharge instructions.  Patient belongings were taken by the patient.  
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
## 2016-05-28 NOTE — Discharge Instructions (Signed)
Use your inhaler 4 puffs every 4 hours while awake. Return if you need to  use it more often.

## 2016-08-31 ENCOUNTER — Ambulatory Visit (HOSPITAL_COMMUNITY)
Admission: EM | Admit: 2016-08-31 | Discharge: 2016-08-31 | Disposition: A | Discharge status: Home / Self Care | Payer: Medicaid Other | Attending: Family Medicine | Admitting: Family Medicine

## 2016-08-31 ENCOUNTER — Encounter (HOSPITAL_COMMUNITY): Payer: Self-pay | Admitting: Emergency Medicine

## 2016-08-31 DIAGNOSIS — L237 Allergic contact dermatitis due to plants, except food: Secondary | ICD-10-CM | POA: Diagnosis not present

## 2016-08-31 DIAGNOSIS — J4520 Mild intermittent asthma, uncomplicated: Secondary | ICD-10-CM | POA: Diagnosis not present

## 2016-08-31 MED ORDER — ALBUTEROL SULFATE HFA 108 (90 BASE) MCG/ACT IN AERS: 2.0000 | INHALATION_SPRAY | RESPIRATORY_TRACT | 0 refills | Status: DC | PRN

## 2016-08-31 MED ORDER — TRIAMCINOLONE ACETONIDE 0.1 % EX CREA: 1.0000 "application " | TOPICAL_CREAM | Freq: Two times a day (BID) | CUTANEOUS | 0 refills | Status: AC

## 2016-08-31 MED ORDER — METHYLPREDNISOLONE 4 MG PO TBPK: ORAL_TABLET | ORAL | 0 refills | Status: DC

## 2016-08-31 NOTE — Discharge Instructions (Signed)
Apply the triamcinolone cream to the rash twice a day. If you find that the rash under your belly is not getting better after 2 days then add Lamisil cream or Lotrimin AF twice a day and keep the area as dry as possible. Take the Medrol/prednisone tablets as directed and with food. This should help both with rash and breathing.

## 2016-08-31 NOTE — ED Triage Notes (Signed)
Ran out of asthma medicine 1-2 weeks ago.  Patient complains of wheezing and sob.    Complains of rash noticed Sunday.  Did do yard work [employer]

## 2016-08-31 NOTE — ED Provider Notes (Signed)
CSN: [id number]     Arrival date & time 08/31/16  4098 History   First MD Initiated Contact with Patient 08/31/16 1019     Chief Complaint  Patient presents with  . Asthma   (Consider location/radiation/quality/duration/timing/severity/associated sxs/prior Treatment) 36 year old male has a history of asthma and is out of his albuterol HFA. He does have a nebulizer at home. He used it this morning and he states his lungs are now clear and has no sense of bronchospasm. He also has a rash primarily to the right forearm as well as the bilateral inguinal folds. He states he was outside chopping down trees and gathering up finds around them. The next day he developed this itchy rash to the right arm and the following day a rash in the day inguinal folds.      Past Medical History:  Diagnosis Date  . Asthma    History reviewed. No pertinent surgical history. No family history on file. Social History  Substance Use Topics  . Smoking status: Never Smoker  . Smokeless tobacco: Never Used  . Alcohol use Yes    Review of Systems  Constitutional: Negative.   HENT: Negative.   Respiratory: Negative.   Gastrointestinal: Negative.   Skin: Positive for rash.  Neurological: Negative.   All other systems reviewed and are negative.   Allergies  Patient has no known allergies.  Home Medications   Prior to Admission medications   Medication Sig Start Date End Date Taking? Authorizing Provider  albuterol (PROVENTIL HFA;VENTOLIN HFA) 108 (90 Base) MCG/ACT inhaler Inhale 2 puffs into the lungs every 4 (four) hours as needed for wheezing or shortness of breath. 08/31/16   Hayden Rasmussen, NP  methylPREDNISolone (MEDROL DOSEPAK) 4 MG TBPK tablet follow package directions 08/31/16   Hayden Rasmussen, NP  oxyCODONE-acetaminophen (PERCOCET) 10-325 MG per tablet Take 1 tablet by mouth every 4 (four) hours as needed for pain.    Historical Provider, MD  triamcinolone cream (KENALOG) 0.1 % Apply 1 application  topically 2 (two) times daily. 08/31/16   Hayden Rasmussen, NP   Meds Ordered and Administered this Visit  Medications - No data to display  BP (!) 133/91 (BP Location: Left Arm) Comment (BP Location): large cuff  Pulse 86   Temp 98 F (36.7 C) (Oral)   Resp (!) 22   SpO2 97%  No data found.   Physical Exam  Constitutional: He appears well-developed and well-nourished.  Eyes: EOM are normal.  Neck: Neck supple.  Cardiovascular: Normal rate, regular rhythm and normal heart sounds.   Pulmonary/Chest: Effort normal and breath sounds normal. No respiratory distress. He has no wheezes. He has no rales.  Neurological: He is alert.  Skin: Skin is warm and dry.  Psychiatric: He has a normal mood and affect.  Nursing note and vitals reviewed.   Urgent Care Course     Procedures (including critical care time)  Labs Review Labs Reviewed - No data to display  Imaging Review No results found.   Visual Acuity Review  Right Eye Distance:   Left Eye Distance:   Bilateral Distance:    Right Eye Near:   Left Eye Near:    Bilateral Near:         MDM   1. Mild intermittent asthma, unspecified whether complicated   2. Allergic contact dermatitis due to plants, except food    Apply the triamcinolone cream to the rash twice a day. If you find that the rash under your belly is not  getting better after 2 days then add Lamisil cream or Lotrimin AF twice a day and keep the area as dry as possible. Take the Medrol/prednisone tablets as directed and with food. This should help both with rash and breathing. Meds ordered this encounter  Medications  . triamcinolone cream (KENALOG) 0.1 %    Sig: Apply 1 application topically 2 (two) times daily.    Dispense:  30 g    Refill:  0    Order Specific Question:   Supervising Provider    Answer:   Elvina Sidle [[id number]]  . methylPREDNISolone (MEDROL DOSEPAK) 4 MG TBPK tablet    Sig: follow package directions    Dispense:  21 tablet    Refill:   0    Order Specific Question:   Supervising Provider    Answer:   Elvina Sidle [[id number]]  . albuterol (PROVENTIL HFA;VENTOLIN HFA) 108 (90 Base) MCG/ACT inhaler    Sig: Inhale 2 puffs into the lungs every 4 (four) hours as needed for wheezing or shortness of breath.    Dispense:  1 Inhaler    Refill:  0    Order Specific Question:   Supervising Provider    Answer:   Lonia Blood       Hayden Rasmussen, NP 08/31/16 1038

## 2016-09-08 ENCOUNTER — Ambulatory Visit: Payer: Medicaid Other | Attending: Family Medicine | Admitting: Family Medicine

## 2016-09-08 ENCOUNTER — Encounter: Payer: Self-pay | Admitting: Family Medicine

## 2016-09-08 VITALS — BP 136/81 | HR 84 | Temp 98.1°F | Resp 18 | Ht 69.0 in | Wt 304.0 lb

## 2016-09-08 DIAGNOSIS — Z0000 Encounter for general adult medical examination without abnormal findings: Secondary | ICD-10-CM | POA: Diagnosis not present

## 2016-09-08 DIAGNOSIS — J4540 Moderate persistent asthma, uncomplicated: Secondary | ICD-10-CM | POA: Diagnosis not present

## 2016-09-08 DIAGNOSIS — I10 Essential (primary) hypertension: Secondary | ICD-10-CM | POA: Diagnosis not present

## 2016-09-08 DIAGNOSIS — Z8249 Family history of ischemic heart disease and other diseases of the circulatory system: Secondary | ICD-10-CM | POA: Insufficient documentation

## 2016-09-08 LAB — POCT UA - MICROALBUMIN
Creatinine, POC: 300 mg/dL
Microalbumin Ur, POC: 30 mg/L

## 2016-09-08 MED ORDER — MONTELUKAST SODIUM 10 MG PO TABS: 10.0000 mg | ORAL_TABLET | Freq: Every day | ORAL | 3 refills | Status: DC

## 2016-09-08 MED ORDER — FLUTICASONE PROPIONATE 50 MCG/ACT NA SUSP: 2.0000 | Freq: Every day | NASAL | 6 refills | Status: DC

## 2016-09-08 MED ORDER — HYDROCHLOROTHIAZIDE 25 MG PO TABS: 12.5000 mg | ORAL_TABLET | Freq: Every day | ORAL | 2 refills | Status: DC

## 2016-09-08 MED ORDER — FLUTICASONE PROPIONATE HFA 110 MCG/ACT IN AERO: 1.0000 | INHALATION_SPRAY | Freq: Two times a day (BID) | RESPIRATORY_TRACT | 2 refills | Status: DC

## 2016-09-08 MED ORDER — ALBUTEROL SULFATE HFA 108 (90 BASE) MCG/ACT IN AERS: 1.0000 | INHALATION_SPRAY | Freq: Four times a day (QID) | RESPIRATORY_TRACT | 2 refills | Status: DC | PRN

## 2016-09-08 MED ORDER — KETOTIFEN FUMARATE 0.025 % OP SOLN: 1.0000 [drp] | Freq: Two times a day (BID) | OPHTHALMIC | 0 refills | Status: DC

## 2016-09-08 MED FILL — PROVENTIL HFA 108 (90 BASE): 108 (90 BAS | 30 days supply | Qty: 7 | Fill #0

## 2016-09-08 MED FILL — FLOVENT HFA 110 MCG INHALER: 110 | 30 days supply | Qty: 1 | Fill #0

## 2016-09-08 MED FILL — MONTELUKAST SOD 10 MG TAB: 10 | 30 days supply | Qty: 30 | Fill #0

## 2016-09-08 MED FILL — FLUTICASONE PROP 50 MCG SPR: 50 | 30 days supply | Qty: 16 | Fill #0

## 2016-09-08 MED FILL — HYDROCHLOROTHIAZIDE 25 MG T: 25 | 30 days supply | Qty: 15 | Fill #0

## 2016-09-08 NOTE — Patient Instructions (Addendum)
Follow up with BP check in 2 weeks. Schedule lab appointment for cholesterol screening. Schedule appointment in 1 month with PCP for asthma follow up.    Asthma, Adult Asthma is a condition of the lungs in which the airways tighten and narrow. Asthma can make it hard to breathe. Asthma cannot be cured, but medicine and lifestyle changes can help control it. Asthma may be started (triggered) by:  Animal skin flakes (dander).  Dust.  Cockroaches.  Pollen.  Mold.  Smoke.  Cleaning products.  Hair sprays or aerosol sprays.  Paint fumes or strong smells.  Cold air, weather changes, and winds.  Crying or laughing hard.  Stress.  Certain medicines or drugs.  Foods, such as dried fruit, potato chips, and sparkling grape juice.  Infections or conditions (colds, flu).  Exercise.  Certain medical conditions or diseases.  Exercise or tiring activities. Follow these instructions at home:  Take medicine as told by your doctor.  Use a peak flow meter as told by your doctor. A peak flow meter is a tool that measures how well the lungs are working.  Record and keep track of the peak flow meter's readings.  Understand and use the asthma action plan. An asthma action plan is a written plan for taking care of your asthma and treating your attacks.  To help prevent asthma attacks:  Do not smoke. Stay away from secondhand smoke.  Change your heating and air conditioning filter often.  Limit your use of fireplaces and wood stoves.  Get rid of pests (such as roaches and mice) and their droppings.  Throw away plants if you see mold on them.  Clean your floors. Dust regularly. Use cleaning products that do not smell.  Have someone vacuum when you are not home. Use a vacuum cleaner with a HEPA filter if possible.  Replace carpet with wood, tile, or vinyl flooring. Carpet can trap animal skin flakes and dust.  Use allergy-proof pillows, mattress covers, and box spring  covers.  Wash bed sheets and blankets every week in hot water and dry them in a dryer.  Use blankets that are made of polyester or cotton.  Clean bathrooms and kitchens with bleach. If possible, have someone repaint the walls in these rooms with mold-resistant paint. Keep out of the rooms that are being cleaned and painted.  Wash hands often. Contact a doctor if:  You have make a whistling sound when breaking (wheeze), have shortness of breath, or have a cough even if taking medicine to prevent attacks.  The colored mucus you cough up (sputum) is thicker than usual.  The colored mucus you cough up changes from clear or white to yellow, green, gray, or bloody.  You have problems from the medicine you are taking such as:  A rash.  Itching.  Swelling.  Trouble breathing.  You need reliever medicines more than 2-3 times a week.  Your peak flow measurement is still at 50-79% of your personal best after following the action plan for 1 hour.  You have a fever. Get help right away if:  You seem to be worse and are not responding to medicine during an asthma attack.  You are short of breath even at rest.  You get short of breath when doing very little activity.  You have trouble eating, drinking, or talking.  You have chest pain.  You have a fast heartbeat.  Your lips or fingernails start to turn blue.  You are light-headed, dizzy, or faint.  Your  peak flow is less than 50% of your personal best. This information is not intended to replace advice given to you by your health care provider. Make sure you discuss any questions you have with your health care provider. Document Released: 10/19/2007 Document Revised: 10/08/2015 Document Reviewed: 11/29/2012 Elsevier Interactive Patient Education  2017 Else[employer].   Hypertension Hypertension is another name for high blood pressure. High blood pressure forces your heart to work harder to pump blood. This can cause problems  over time. There are two numbers in a blood pressure reading. There is a top number (systolic) over a bottom number (diastolic). It is best to have a blood pressure below 120/80. Healthy choices can help lower your blood pressure. You may need medicine to help lower your blood pressure if:  Your blood pressure cannot be lowered with healthy choices.  Your blood pressure is higher than 130/80. Follow these instructions at home: Eating and drinking   If directed, follow the DASH eating plan. This diet includes:  Filling half of your plate at each meal with fruits and vegetables.  Filling one quarter of your plate at each meal with whole grains. Whole grains include whole wheat pasta, brown rice, and whole grain bread.  Eating or drinking low-fat dairy products, such as skim milk or low-fat yogurt.  Filling one quarter of your plate at each meal with low-fat (lean) proteins. Low-fat proteins include fish, skinless chicken, eggs, beans, and tofu.  Avoiding fatty meat, cured and processed meat, or chicken with skin.  Avoiding premade or processed food.  Eat less than 1,500 mg of salt (sodium) a day.  Limit alcohol use to no more than 1 drink a day for nonpregnant women and 2 drinks a day for men. One drink equals 12 oz of beer, 5 oz of wine, or 1 oz of hard liquor. Lifestyle   Work with your doctor to stay at a healthy weight or to lose weight. Ask your doctor what the best weight is for you.  Get at least 30 minutes of exercise that causes your heart to beat faster (aerobic exercise) most days of the week. This may include walking, swimming, or biking.  Get at least 30 minutes of exercise that strengthens your muscles (resistance exercise) at least 3 days a week. This may include lifting weights or pilates.  Do not use any products that contain nicotine or tobacco. This includes cigarettes and e-cigarettes. If you need help quitting, ask your doctor.  Check your blood pressure at  home as told by your doctor.  Keep all follow-up visits as told by your doctor. This is important. Medicines   Take over-the-counter and prescription medicines only as told by your doctor. Follow directions carefully.  Do not skip doses of blood pressure medicine. The medicine does not work as well if you skip doses. Skipping doses also puts you at risk for problems.  Ask your doctor about side effects or reactions to medicines that you should watch for. Contact a doctor if:  You think you are having a reaction to the medicine you are taking.  You have headaches that keep coming back (recurring).  You feel dizzy.  You have swelling in your ankles.  You have trouble with your vision. Get help right away if:  You get a very bad headache.  You start to feel confused.  You feel weak or numb.  You feel faint.  You get very bad pain in your:  Chest.  Belly (abdomen).  You  throw up (vomit) more than once.  You have trouble breathing. Summary  Hypertension is another name for high blood pressure.  Making healthy choices can help lower blood pressure. If your blood pressure cannot be controlled with healthy choices, you may need to take medicine. This information is not intended to replace advice given to you by your health care provider. Make sure you discuss any questions you have with your health care provider. Document Released: 10/19/2007 Document Revised: 03/30/2016 Document Reviewed: 03/30/2016 Elsevier Interactive Patient Education  2017 Else[employer] Inc.   Hydrochlorothiazide, HCTZ capsules or tablets What is this medicine? HYDROCHLOROTHIAZIDE (hye droe klor oh THYE a zide) is a diuretic. It increases the amount of urine passed, which causes the body to lose salt and water. This medicine is used to treat high blood pressure. It is also reduces the swelling and water retention caused by various medical conditions, such as heart, liver, or kidney disease. This medicine  may be used for other purposes; ask your health care provider or pharmacist if you have questions. COMMON BRAND NAME(S): Esidrix, Ezide, HydroDIURIL, Microzide, Oretic, Zide What should I tell my health care provider before I take this medicine? They need to know if you have any of these conditions: -diabetes -gout -immune system problems, like lupus -kidney disease or kidney stones -liver disease -pancreatitis -small amount of urine or difficulty passing urine -an unusual or allergic reaction to hydrochlorothiazide, sulfa drugs, other medicines, foods, dyes, or preservatives -pregnant or trying to get pregnant -breast-feeding How should I use this medicine? Take this medicine by mouth with a glass of water. Follow the directions on the prescription label. Take your medicine at regular intervals. Remember that you will need to pass urine frequently after taking this medicine. Do not take your doses at a time of day that will cause you problems. Do not stop taking your medicine unless your doctor tells you to. Talk to your pediatrician regarding the use of this medicine in children. Special care may be needed. Overdosage: If you think you have taken too much of this medicine contact a poison control center or emergency room at once. NOTE: This medicine is only for you. Do not share this medicine with others. What if I miss a dose? If you miss a dose, take it as soon as you can. If it is almost time for your next dose, take only that dose. Do not take double or extra doses. What may interact with this medicine? -cholestyramine -colestipol -digoxin -dofetilide -lithium -medicines for blood pressure -medicines for diabetes -medicines that relax muscles for surgery -other diuretics -steroid medicines like prednisone or cortisone This list may not describe all possible interactions. Give your health care provider a list of all the medicines, herbs, non-prescription drugs, or dietary  supplements you use. Also tell them if you smoke, drink alcohol, or use illegal drugs. Some items may interact with your medicine. What should I watch for while using this medicine? Visit your doctor or health care professional for regular checks on your progress. Check your blood pressure as directed. Ask your doctor or health care professional what your blood pressure should be and when you should contact him or her. You may need to be on a special diet while taking this medicine. Ask your doctor. Check with your doctor or health care professional if you get an attack of severe diarrhea, nausea and vomiting, or if you sweat a lot. The loss of too much body fluid can make it dangerous for you  to take this medicine. You may get drowsy or dizzy. Do not drive, use machinery, or do anything that needs mental alertness until you know how this medicine affects you. Do not stand or sit up quickly, especially if you are an older patient. This reduces the risk of dizzy or fainting spells. Alcohol may interfere with the effect of this medicine. Avoid alcoholic drinks. This medicine may affect your blood sugar level. If you have diabetes, check with your doctor or health care professional before changing the dose of your diabetic medicine. This medicine can make you more sensitive to the sun. Keep out of the sun. If you cannot avoid being in the sun, wear protective clothing and use sunscreen. Do not use sun lamps or tanning beds/booths. What side effects may I notice from receiving this medicine? Side effects that you should report to your doctor or health care professional as soon as possible: -allergic reactions such as skin rash or itching, hives, swelling of the lips, mouth, tongue, or throat -changes in vision -chest pain -eye pain -fast or irregular heartbeat -feeling faint or lightheaded, falls -gout attack -muscle pain or cramps -pain or difficulty when passing urine -pain, tingling, numbness in  the hands or feet -redness, blistering, peeling or loosening of the skin, including inside the mouth -unusually weak or tired Side effects that usually do not require medical attention (report to your doctor or health care professional if they continue or are bothersome): -change in sex drive or performance -dry mouth -headache -stomach upset This list may not describe all possible side effects. Call your doctor for medical advice about side effects. You may report side effects to FDA at 1-800-FDA-1088. Where should I keep my medicine? Keep out of the reach of children. Store at room temperature between 15 and 30 degrees C (59 and 86 degrees F). Do not freeze. Protect from light and moisture. Keep container closed tightly. Throw away any unused medicine after the expiration date. NOTE: This sheet is a summary. It may not cover all possible information. If you have questions about this medicine, talk to your doctor, pharmacist, or health care provider.  2018 Elsevier/Gold Standard (2009-12-25 12:57:37)

## 2016-09-08 NOTE — Progress Notes (Signed)
Patient is here for asthma  Patient needs refill on medication  Patient has taking his current med for today  Patient has eaten for today  Patient denies pain for today

## 2016-09-08 NOTE — Progress Notes (Signed)
Subjective:  Patient ID: William Crane, male    DOB: [birth year]  Age: 36 y.o. MRN: [id number]  CC: Establish Care   HPI William Crane presents for   Asthma: Reports history of asthma since infancy. History of asthma exacerbation a month and a half ago which required ED visit.Takes albuterol and pro-air inhaler  for symptoms. Reports using albuterol inhaler daily, 3 to 4 times per day. Reports alternating between pro-air and albuterol when pro-air runs out. He has been without his pro-air inhaler for several months. Symptoms include shortness of breath and wheezing..Asthma triggers dogs, dust, and pollen,   Elevated BP: History of elevated BP. Denies any CP, headaches, or BLE swelling.He is a non-smoker. Family history of HTN Mother and Father.   Outpatient Medications Prior to Visit  Medication Sig Dispense Refill  . oxyCODONE-acetaminophen (PERCOCET) 10-325 MG per tablet Take 1 tablet by mouth every 4 (four) hours as needed for pain.    Marland Kitchen triamcinolone cream (KENALOG) 0.1 % Apply 1 application topically 2 (two) times daily. 30 g 0  . albuterol (PROVENTIL HFA;VENTOLIN HFA) 108 (90 Base) MCG/ACT inhaler Inhale 2 puffs into the lungs every 4 (four) hours as needed for wheezing or shortness of breath. 1 Inhaler 0  . methylPREDNISolone (MEDROL DOSEPAK) 4 MG TBPK tablet follow package directions 21 tablet 0   No facility-administered medications prior to visit.     ROS Review of Systems  Constitutional: Negative.   Respiratory: Positive for shortness of breath and wheezing.   Cardiovascular: Negative.   Gastrointestinal: Negative.   Skin: Negative.   Neurological: Negative.   Psychiatric/Behavioral: Negative.     Objective:  BP 136/81 (BP Location: Left Arm, Patient Position: Sitting, Cuff Size: Normal)   Pulse 84   Temp 98.1 F (36.7 C) (Oral)   Resp 18   Ht _0  (1.753 m)   Wt (!) 304 lb (137.9 kg)   SpO2 98%   BMI 44.89 kg/m   BP/Weight 09/08/2016 08/31/2016 12/27/4816  Systolic  BP 563 149 702  Diastolic BP 81 91 66  Wt. (Lbs) 304 - -  BMI 44.89 - -   Physical Exam  Constitutional: He is oriented to person, place, and time. He appears well-developed and well-nourished.  HENT:  Head: Normocephalic.  Right Ear: External ear normal.  Left Ear: External ear normal.  Nose: Nose normal.  Mouth/Throat: Oropharynx is clear and moist.  Eyes: Conjunctivae are normal. Pupils are equal, round, and reactive to light.  Neck: Normal range of motion. No JVD present.  Cardiovascular: Normal rate, regular rhythm, normal heart sounds and intact distal pulses.   Pulmonary/Chest: Effort normal and breath sounds normal. No respiratory distress. He has no wheezes.  Abdominal: Soft. Bowel sounds are normal.  Neurological: He is alert and oriented to person, place, and time.  Skin: Skin is warm and dry.  Nursing note and vitals reviewed.  Assessment & Plan:   Problem List Items Addressed This Visit    None    Visit Diagnoses    Moderate persistent asthma without complication    -  Primary   Follow up with PCP in month for asthma   Relevant Medications   albuterol (PROVENTIL HFA;VENTOLIN HFA) 108 (90 Base) MCG/ACT inhaler   fluticasone (FLOVENT HFA) 110 MCG/ACT inhaler   montelukast (SINGULAIR) 10 MG tablet   fluticasone (FLONASE) 50 MCG/ACT nasal spray   ketotifen (ALAWAY) 0.025 % ophthalmic solution   Hypertension, unspecified type       Schedule BP recheck  in 2 weeks with clinic RN   If BP is greater than 90/60 (MAP 65 or greater) but not less than 130/80 may add amlodipine 5 mg QD recheck in another 2 weeks with clinic RN.      Relevant Medications   hydrochlorothiazide (HYDRODIURIL) 25 MG tablet   Other Relevant Orders   CMP14+EGFR (Completed)   POCT UA - Microalbumin (Completed)   Healthcare maintenance       Relevant Orders   HIV antibody (with reflex) (Completed)      Meds ordered this encounter  Medications  . albuterol (PROVENTIL HFA;VENTOLIN HFA) 108  (90 Base) MCG/ACT inhaler    Sig: Inhale 1 puff into the lungs every 6 (six) hours as needed for wheezing or shortness of breath.    Dispense:  1 Inhaler    Refill:  2    Order Specific Question:   Supervising Provider    Answer:   Tresa Garter [id number]  . fluticasone (FLOVENT HFA) 110 MCG/ACT inhaler    Sig: Inhale 1 puff into the lungs 2 (two) times daily.    Dispense:  1 Inhaler    Refill:  2    Order Specific Question:   Supervising Provider    Answer:   Tresa Garter [id number]  . montelukast (SINGULAIR) 10 MG tablet    Sig: Take 1 tablet (10 mg total) by mouth at bedtime.    Dispense:  90 tablet    Refill:  3    Order Specific Question:   Supervising Provider    Answer:   Tresa Garter [id number]  . fluticasone (FLONASE) 50 MCG/ACT nasal spray    Sig: Place 2 sprays into both nostrils daily.    Dispense:  16 g    Refill:  6    Order Specific Question:   Supervising Provider    Answer:   Tresa Garter [id number]  . ketotifen (ALAWAY) 0.025 % ophthalmic solution    Sig: Place 1 drop into both eyes 2 (two) times daily.    Dispense:  5 mL    Refill:  0    Order Specific Question:   Supervising Provider    Answer:   Tresa Garter [id number]  . hydrochlorothiazide (HYDRODIURIL) 25 MG tablet    Sig: Take 0.5 tablets (12.5 mg total) by mouth daily.    Dispense:  30 tablet    Refill:  2    Order Specific Question:   Supervising Provider    Answer:   Tresa Garter [id number]    Follow-up: Return in about 2 weeks (around 09/22/2016), or if symptoms worsen or fail to improve, for BP check with clinic RN.  Alfonse Spruce FNP

## 2016-09-09 LAB — CMP14+EGFR
ALK PHOS: 68 IU/L (ref 39–117)
ALT: 31 IU/L (ref 0–44)
AST: 18 IU/L (ref 0–40)
Albumin/Globulin Ratio: 1.3 (ref 1.2–2.2)
Albumin: 3.8 g/dL (ref 3.5–5.5)
BUN/Creatinine Ratio: 13 (ref 9–20)
BUN: 13 mg/dL (ref 6–20)
Bilirubin Total: 0.6 mg/dL (ref 0.0–1.2)
CO2: 23 mmol/L (ref 18–29)
CREATININE: 1.01 mg/dL (ref 0.76–1.27)
Calcium: 8.7 mg/dL (ref 8.7–10.2)
Chloride: 102 mmol/L (ref 96–106)
GFR calc Af Amer: 111 mL/min/{1.73_m2} (ref >59)
GFR calc non Af Amer: 96 mL/min/{1.73_m2} (ref >59)
GLOBULIN, TOTAL: 2.9 g/dL (ref 1.5–4.5)
GLUCOSE: 116 mg/dL — AB (ref 65–99)
Potassium: 4.2 mmol/L (ref 3.5–5.2)
SODIUM: 139 mmol/L (ref 134–144)
Total Protein: 6.7 g/dL (ref 6.0–8.5)

## 2016-09-09 LAB — HIV ANTIBODY (ROUTINE TESTING W REFLEX): HIV SCREEN 4TH GENERATION: NONREACTIVE

## 2016-09-14 ENCOUNTER — Telehealth: Payer: Self-pay

## 2016-09-14 NOTE — Telephone Encounter (Signed)
-----   Message from Lizbeth Bark, FNP sent at 09/13/2016  4:07 PM EDT ----- HIV is negative.  Liver function normal Kidney function normal

## 2016-09-14 NOTE — Telephone Encounter (Signed)
CMA call patient regarding lab results   Patient Verify DOB   Patient was aware and understood  

## 2016-09-22 ENCOUNTER — Other Ambulatory Visit: Payer: Self-pay | Admitting: Family Medicine

## 2016-09-22 ENCOUNTER — Ambulatory Visit: Payer: Medicaid Other | Attending: Family Medicine | Admitting: *Deleted

## 2016-09-22 ENCOUNTER — Other Ambulatory Visit: Payer: Self-pay | Admitting: *Deleted

## 2016-09-22 ENCOUNTER — Ambulatory Visit: Payer: Medicaid Other

## 2016-09-22 VITALS — BP 140/80

## 2016-09-22 DIAGNOSIS — J4540 Moderate persistent asthma, uncomplicated: Secondary | ICD-10-CM

## 2016-09-22 DIAGNOSIS — I10 Essential (primary) hypertension: Secondary | ICD-10-CM | POA: Diagnosis present

## 2016-09-22 MED ORDER — ALBUTEROL SULFATE HFA 108 (90 BASE) MCG/ACT IN AERS: 1.0000 | INHALATION_SPRAY | Freq: Four times a day (QID) | RESPIRATORY_TRACT | 1 refills | Status: DC | PRN

## 2016-09-22 MED FILL — PROAIR HFA 90 MCG INHALER: 108 (90 BAS | 25 days supply | Qty: 9 | Fill #0 | Status: TO

## 2016-09-22 NOTE — Progress Notes (Signed)
[facility] Pt alert and oriented and arrives in good spirits. Last OV  09/08/2016 with Larose KellsM. Hairston, FNP.  Pt denies chest pain, SOB, HA, dizziness, or blurred vision.  Verified medication. Pt states medication was taken this morning.  Manual blood pressure reading: 140/80 and 136/82  Nurse visit will be routed to provider

## 2016-09-22 NOTE — Progress Notes (Signed)
Patient here for lab visit only 

## 2016-09-23 LAB — LIPID PANEL
CHOL/HDL RATIO: 4.3 ratio (ref 0.0–5.0)
CHOLESTEROL TOTAL: 203 mg/dL — AB (ref 100–199)
HDL: 47 mg/dL (ref >39)
LDL CALC: 132 mg/dL — AB (ref 0–99)
TRIGLYCERIDES: 118 mg/dL (ref 0–149)
VLDL CHOLESTEROL CAL: 24 mg/dL (ref 5–40)

## 2016-09-29 ENCOUNTER — Telehealth: Payer: Self-pay

## 2016-09-29 NOTE — Telephone Encounter (Signed)
-----   Message from Lizbeth BarkMandesia R Hairston, FNP sent at 09/29/2016  3:04 PM EDT ----- Lipid levels were elevated. This can increase your risk of heart disease overtime. Start eating a diet low in saturated fat. Limit your intake of fried foods, red meats, and whole milk. Increase physical activity. Recommend follow up in 1 year.

## 2016-09-29 NOTE — Telephone Encounter (Signed)
CMA call  Regarding lab results  Patient Verify DOB   Patient was aware and understood

## 2016-10-06 ENCOUNTER — Encounter: Payer: Self-pay | Admitting: Family Medicine

## 2016-10-06 ENCOUNTER — Ambulatory Visit: Payer: Medicaid Other | Attending: Family Medicine | Admitting: Family Medicine

## 2016-10-06 VITALS — BP 137/84 | HR 65 | Temp 97.8°F | Resp 18 | Ht 70.0 in | Wt 300.2 lb

## 2016-10-06 DIAGNOSIS — E785 Hyperlipidemia, unspecified: Secondary | ICD-10-CM | POA: Diagnosis not present

## 2016-10-06 DIAGNOSIS — E669 Obesity, unspecified: Secondary | ICD-10-CM | POA: Insufficient documentation

## 2016-10-06 DIAGNOSIS — J4540 Moderate persistent asthma, uncomplicated: Secondary | ICD-10-CM | POA: Insufficient documentation

## 2016-10-06 DIAGNOSIS — Z6841 Body Mass Index (BMI) 40.0 and over, adult: Secondary | ICD-10-CM | POA: Insufficient documentation

## 2016-10-06 DIAGNOSIS — I10 Essential (primary) hypertension: Secondary | ICD-10-CM | POA: Diagnosis not present

## 2016-10-06 MED ORDER — MONTELUKAST SODIUM 10 MG PO TABS: 10.0000 mg | ORAL_TABLET | Freq: Every day | ORAL | 3 refills | Status: AC

## 2016-10-06 MED ORDER — ALBUTEROL SULFATE HFA 108 (90 BASE) MCG/ACT IN AERS: 1.0000 | INHALATION_SPRAY | Freq: Four times a day (QID) | RESPIRATORY_TRACT | 2 refills | Status: DC | PRN

## 2016-10-06 MED ORDER — FLUTICASONE PROPIONATE HFA 220 MCG/ACT IN AERO: 1.0000 | INHALATION_SPRAY | Freq: Two times a day (BID) | RESPIRATORY_TRACT | 3 refills | Status: AC

## 2016-10-06 MED ORDER — AMLODIPINE BESYLATE 2.5 MG PO TABS: 2.5000 mg | ORAL_TABLET | Freq: Every day | ORAL | 2 refills | Status: AC

## 2016-10-06 MED ORDER — HYDROCHLOROTHIAZIDE 25 MG PO TABS: 25.0000 mg | ORAL_TABLET | Freq: Every day | ORAL | 0 refills | Status: AC

## 2016-10-06 NOTE — Progress Notes (Signed)
Subjective:  Patient ID: William Crane, male    DOB: 08-13-1980  Age: 36 y.o. MRN: [id number]  CC: Asthma   HPI William Crane presents for asthma follow-up: He has previously been evaluated here for asthma and presents for an asthma follow-up; he is not currently in exacerbation. Symptoms currently include dyspnea, non-productive cough and wheezing and occur daily. Observed precipitants include animal dander, dust and pollens.  Current limitations in activity from asthma: none.  Number of days of school or work missed in the last month: not applicable. Number of Emergency Department visits in the previous month: 1. Frequency of use of quick-relief meds: reports daily Albuterol inhaleruse.  He reports adherence with Flovent use. He denies adherence with singular use.    Hypertension: Patient here for follow-up of elevated blood pressure. He is not exercising and is not adherent to low salt diet.  Blood pressure He does not take BP at home. well controlled at home. Cardiac symptoms none. Patient denies chest pain, claudication, dyspnea, lower extremity edema, near-syncope and palpitations.  Cardiovascular risk factors: dyslipidemia, hypertension, male gender, obesity (BMI >= 30 kg/m2) and sedentary lifestyle. Use of agents associated with hypertension: none. History of target organ damage: none.    Outpatient Medications Prior to Visit  Medication Sig Dispense Refill  . fluticasone (FLONASE) 50 MCG/ACT nasal spray Place 2 sprays into both nostrils daily. 16 g 6  . ketotifen (ALAWAY) 0.025 % ophthalmic solution Place 1 drop into both eyes 2 (two) times daily. (Patient not taking: Reported on 09/22/2016) 5 mL 0  . methylPREDNISolone (MEDROL DOSEPAK) 4 MG TBPK tablet follow package directions (Patient not taking: Reported on 09/22/2016) 21 tablet 0  . oxyCODONE-acetaminophen (PERCOCET) 10-325 MG per tablet Take 1 tablet by mouth every 4 (four) hours as needed for pain.    Marland Kitchen triamcinolone cream (KENALOG) 0.1 %  Apply 1 application topically 2 (two) times daily. 30 g 0  . albuterol (PROVENTIL HFA;VENTOLIN HFA) 108 (90 Base) MCG/ACT inhaler Inhale 1 puff into the lungs every 6 (six) hours as needed for wheezing or shortness of breath. 1 Inhaler 1  . fluticasone (FLOVENT HFA) 110 MCG/ACT inhaler Inhale 1 puff into the lungs 2 (two) times daily. 1 Inhaler 2  . hydrochlorothiazide (HYDRODIURIL) 25 MG tablet Take 0.5 tablets (12.5 mg total) by mouth daily. 30 tablet 2  . montelukast (SINGULAIR) 10 MG tablet Take 1 tablet (10 mg total) by mouth at bedtime. 90 tablet 3   No facility-administered medications prior to visit.     ROS Review of Systems  Constitutional: Negative.   HENT: Negative.   Eyes: Negative.   Respiratory: Negative.   Cardiovascular: Negative.   Skin: Negative.   Neurological: Negative.   Psychiatric/Behavioral: Negative.    Objective:  BP 137/84 (BP Location: Left Arm, Patient Position: Sitting, Cuff Size: Normal)   Pulse 65   Temp 97.8 F (36.6 C) (Oral)   Resp 18   Ht 5\' 10"  (1.778 m)   Wt (!) 300 lb 3.2 oz (136.2 kg)   SpO2 98%   BMI 43.07 kg/m   BP/Weight 10/06/2016 09/22/2016 09/08/2016  Systolic BP 137 140 136  Diastolic BP 84 80 81  Wt. (Lbs) 300.2 - 304  BMI 43.07 - 44.89    Physical Exam  Constitutional: He is oriented to person, place, and time. He appears well-developed and well-nourished.  HENT:  Head: Normocephalic and atraumatic.  Right Ear: External ear normal.  Left Ear: External ear normal.  Nose: Nose normal.  Mouth/Throat: Oropharynx is clear and moist.  Eyes: Conjunctivae are normal. Pupils are equal, round, and reactive to light.  Neck: No JVD present.  Cardiovascular: Normal rate, regular rhythm, normal heart sounds and intact distal pulses.   Pulmonary/Chest: Effort normal and breath sounds normal. No respiratory distress. He has no wheezes.  Lymphadenopathy:    He has no cervical adenopathy.  Neurological: He is alert and oriented to  person, place, and time.  Skin: Skin is warm and dry.  Psychiatric: He has a normal mood and affect.  Nursing note and vitals reviewed.   Assessment & Plan:   Problem List Items Addressed This Visit    None    Visit Diagnoses    Moderate persistent asthma without complication    -  Primary   Increase Flovent dosage.   Encouraged use daily montelukast to help reduce asthma symptoms.   Follow up in 1 month of if symptoms worsen or persist.   Relevant Medications   fluticasone (FLOVENT HFA) 220 MCG/ACT inhaler   montelukast (SINGULAIR) 10 MG tablet   albuterol (PROVENTIL HFA;VENTOLIN HFA) 108 (90 Base) MCG/ACT inhaler   Hypertension, unspecified type       BP still not at goal.    Increase dosage of HTCZ and will add amlodipine 2.5 mg QD.    Follow up in 1 month.    Relevant Medications   hydrochlorothiazide (HYDRODIURIL) 25 MG tablet   amLODipine (NORVASC) 2.5 MG tablet      Meds ordered this encounter  Medications  . hydrochlorothiazide (HYDRODIURIL) 25 MG tablet    Sig: Take 1 tablet (25 mg total) by mouth daily.    Dispense:  90 tablet    Refill:  0    Order Specific Question:   Supervising Provider    Answer:   Quentin AngstJEGEDE, OLUGBEMIGA E [id number]]  . amLODipine (NORVASC) 2.5 MG tablet    Sig: Take 1 tablet (2.5 mg total) by mouth daily.    Dispense:  30 tablet    Refill:  2    Order Specific Question:   Supervising Provider    Answer:   Quentin AngstJEGEDE, OLUGBEMIGA E [id number]]  . fluticasone (FLOVENT HFA) 220 MCG/ACT inhaler    Sig: Inhale 1 puff into the lungs 2 (two) times daily.    Dispense:  1 Inhaler    Refill:  3    Order Specific Question:   Supervising Provider    Answer:   Quentin AngstJEGEDE, OLUGBEMIGA E [id number]]  . montelukast (SINGULAIR) 10 MG tablet    Sig: Take 1 tablet (10 mg total) by mouth at bedtime.    Dispense:  90 tablet    Refill:  3    Order Specific Question:   Supervising Provider    Answer:   Quentin AngstJEGEDE, OLUGBEMIGA E [id number]]  . albuterol (PROVENTIL HFA;VENTOLIN  HFA) 108 (90 Base) MCG/ACT inhaler    Sig: Inhale 1 puff into the lungs every 6 (six) hours as needed for wheezing or shortness of breath.    Dispense:  1 Inhaler    Refill:  2    Order Specific Question:   Supervising Provider    Answer:   Quentin AngstJEGEDE, OLUGBEMIGA E [id number]]    Follow-up: Return in about 1 month (around 11/06/2016) for HTN/Asthma.   Lizbeth BarkMandesia R Torry Istre FNP

## 2016-10-06 NOTE — Progress Notes (Signed)
Patient is here for f/up  Patient denies pain for today   Patient has taking his medication  Patient ha snot eaten for today

## 2016-10-06 NOTE — Patient Instructions (Signed)
Asthma Attack Prevention, Adult Although you may not be able to control the fact that you have asthma, you can take actions to prevent episodes of asthma (asthma attacks). These actions include:  Creating a written plan for managing and treating your asthma attacks (asthma action plan).  Monitoring your asthma.  Avoiding things that can irritate your airways or make your asthma symptoms worse (asthma triggers).  Taking your medicines as directed.  Acting quickly if you have signs or symptoms of an asthma attack. What are some ways to prevent an asthma attack? Create a plan Work with your health care provider to create an asthma action plan. This plan should include:  A list of your asthma triggers and how to avoid them.  A list of symptoms that you experience during an asthma attack.  Information about when to take medicine and how much medicine to take.  Information to help you understand your peak flow measurements.  Contact information for your health care providers.  Daily actions that you can take to control asthma. Monitor your asthma   To monitor your asthma:  Use your peak flow meter every morning and every evening for 2-3 weeks. Record the results in a journal. A drop in your peak flow numbers on one or more days may mean that you are starting to have an asthma attack, even if you are not having symptoms.  When you have asthma symptoms, write them down in a journal. Avoid asthma triggers   Work with your health care provider to find out what your asthma triggers are. This can be done by:  Being tested for allergies.  Keeping a journal that notes when asthma attacks occur and what may have contributed to them.  Asking your health care provider whether other medical conditions make your asthma worse. Common asthma triggers include:  Dust.  Smoke. This includes campfire smoke and secondhand smoke from tobacco products.  Pet dander.  Trees, grasses or  pollens.  Very cold, dry, or humid air.  Mold.  Foods that contain high amounts of sulfites.  Strong smells.  Engine exhaust and air pollution.  Aerosol sprays and fumes from household cleaners.  Household pests and their droppings, including dust mites and cockroaches.  Certain medicines, including NSAIDs. Once you have determined your asthma triggers, take steps to avoid them. Depending on your triggers, you may be able to reduce the chance of an asthma attack by:  Keeping your home clean. Have someone dust and vacuum your home for you 1 or 2 times a week. If possible, have them use a high-efficiency particulate arrestance (HEPA) vacuum.  Washing your sheets weekly in hot water.  Using allergy-proof mattress covers and casings on your bed.  Keeping pets out of your home.  Taking care of mold and water problems in your home.  Avoiding areas where people smoke.  Avoiding using strong perfumes or odor sprays.  Avoid spending a lot of time outdoors when pollen counts are high and on very windy days.  Talking with your health care provider before stopping or starting any new medicines. Medicines Take over-the-counter and prescription medicines only as told by your health care provider. Many asthma attacks can be prevented by carefully following your medicine schedule. Taking your medicines correctly is especially important when you cannot avoid certain asthma triggers. Even if you are doing well, do not stop taking your medicine and do not take less medicine. Act quickly If an asthma attack happens, acting quickly can decrease how severe   decrease how severe it is and how long it lasts. Take these actions:  Pay attention to your symptoms. If you are coughing, wheezing, or having difficulty breathing, do not wait to see if your symptoms go away on their own. Follow your asthma action plan.  If you have followed your asthma action plan and your symptoms are not improving, call your health care  provider or seek immediate medical care at the nearest hospital. It is important to write down how often you need to use your fast-acting rescue inhaler. You can track how often you use an inhaler in your journal. If you are using your rescue inhaler more often, it may mean that your asthma is not under control. Adjusting your asthma treatment plan may help you to prevent future asthma attacks and help you to gain better control of your condition. How can I prevent an asthma attack when I exercise?   Exercise is a common asthma trigger. To prevent asthma attacks during exercise:  Follow advice from your health care provider about whether you should use your fast-acting inhaler before exercising. Many people with asthma experience exercise-induced bronchoconstriction (EIB). This condition often worsens during vigorous exercise in cold, humid, or dry environments. Usually, people with EIB can stay very active by using a fast-acting inhaler before exercising.  Avoid exercising outdoors in very cold or humid weather.  Avoid exercising outdoors when pollen counts are high.  Warm up and cool down when exercising.  Stop exercising right away if asthma symptoms start. Consider taking part in exercises that are less likely to cause asthma symptoms such as:  Indoor swimming.  Biking.  Walking.  Hiking.  Playing football. This information is not intended to replace advice given to you by your health care provider. Make sure you discuss any questions you have with your health care provider. Document Released: 04/20/2009 Document Revised: 01/01/2016 Document Reviewed: 10/17/2015 Elsevier Interactive Patient Education  2017 [employer] Fluticasone inhalation aerosol What is this medicine? FLUTICASONE (floo TIK a sone) inhalation is a corticosteroid. It helps decrease inflammation in your lungs. This medicine is used to treat the symptoms of asthma. Never use this medicine for an acute asthma  attack. This medicine may be used for other purposes; ask your health care provider or pharmacist if you have questions. COMMON BRAND NAME(S): Flovent, Flovent HFA What should I tell my health care provider before I take this medicine? They need to know if you have any of these conditions: -bone problems -immune system problems -infection, like chickenpox, tuberculosis, herpes, or fungal infection -recent surgery or injury of mouth or throat -taking corticosteroids by mouth -an unusual or allergic reaction to fluticasone, steroids, other medicines, foods, dyes, or preservatives -pregnant or trying to get pregnant -breast-feeding How should I use this medicine? This medicine is for inhalation through the mouth. Rinse your mouth with water after use. Make sure not to swallow the water. Follow the directions on your prescription label. Do not use more often than directed. Do not stop taking your medicine unless your doctor tells you to. Make sure that you are using your inhaler correctly. Ask you doctor or health care provider if you have any questions. Talk to your pediatrician regarding the use of this medicine in children. Special care may be needed. While this drug may be prescribed for children as young as 62 years of age for selected conditions, precautions do apply. Overdosage: If you think you have taken too much of this medicine contact a poison control  center or emergency room at once. NOTE: This medicine is only for you. Do not share this medicine with others. What if I miss a dose? If you miss a dose, use it as soon as you remember. If it is almost time for your next dose, use only that dose and continue with your regular schedule, spacing doses evenly. Do not use double or extra doses. What may interact with this medicine? -antiviral medicines for HIV or AIDS -certain antibiotics like clarithromycin and telithromycin -certain medicines for fungal infections like ketoconazole,  itraconazole, posaconazole, voriconazole -conivaptan -ketoconazole -nefazodone This list may not describe all possible interactions. Give your health care provider a list of all the medicines, herbs, non-prescription drugs, or dietary supplements you use. Also tell them if you smoke, drink alcohol, or use illegal drugs. Some items may interact with your medicine. What should I watch for while using this medicine? Visit your doctor or health care professional for regular checks on your progress. Check with your health care professional if your symptoms do not improve. If your symptoms get worse or if you need your short-acting inhalers more often, call your doctor right away. Try not to come in contact with people who have chickenpox or the measles while you are taking this medicine. If you do, call your doctor right away. What side effects may I notice from receiving this medicine? Side effects that you should report to your doctor or health care professional as soon as possible: -allergic reactions like skin rash, itching or hives -changes in vision -chest pain -flu-like symptoms -trouble breathing or wheezing -unusual swelling -white patches or sores in the mouth or throat Side effects that usually do not require medical attention (report to your doctor or health care professional if they continue or are bothersome): -coughing, hoarseness or throat irritation -dry mouth -flushing -headache -loss of taste, or unpleasant taste This list may not describe all possible side effects. Call your doctor for medical advice about side effects. You may report side effects to FDA at 1-800-FDA-1088. Where should I keep my medicine? Keep out of the reach of children. Store at room temperature between 15 and 30 degrees C (59 and 86 degrees F) with the mouthpiece down. Do not puncture the canister. Do not store it or use it near heat or an open flame. Exposure to temperatures above 120 degrees F may cause  it to burst. Never throw it into a fire or incinerator. Throw away after the expiration date. NOTE: This sheet is a summary. It may not cover all possible information. If you have questions about this medicine, talk to your doctor, pharmacist, or health care provider.  2018 Elsevier/Gold Standard (2016-04-20 15:57:32) Asthma, Adult Asthma is a condition of the lungs in which the airways tighten and narrow. Asthma can make it hard to breathe. Asthma cannot be cured, but medicine and lifestyle changes can help control it. Asthma may be started (triggered) by:  Animal skin flakes (dander).  Dust.  Cockroaches.  Pollen.  Mold.  Smoke.  Cleaning products.  Hair sprays or aerosol sprays.  Paint fumes or strong smells.  Cold air, weather changes, and winds.  Crying or laughing hard.  Stress.  Certain medicines or drugs.  Foods, such as dried fruit, potato chips, and sparkling grape juice.  Infections or conditions (colds, flu).  Exercise.  Certain medical conditions or diseases.  Exercise or tiring activities. Follow these instructions at home:  Take medicine as told by your doctor.  Use a peak flow  meter as told by your doctor. A peak flow meter is a tool that measures how well the lungs are working.  Record and keep track of the peak flow meter's readings.  Understand and use the asthma action plan. An asthma action plan is a written plan for taking care of your asthma and treating your attacks.  To help prevent asthma attacks:  Do not smoke. Stay away from secondhand smoke.  Change your heating and air conditioning filter often.  Limit your use of fireplaces and wood stoves.  Get rid of pests (such as roaches and mice) and their droppings.  Throw away plants if you see mold on them.  Clean your floors. Dust regularly. Use cleaning products that do not smell.  Have someone vacuum when you are not home. Use a vacuum cleaner with a HEPA filter if  possible.  Replace carpet with wood, tile, or vinyl flooring. Carpet can trap animal skin flakes and dust.  Use allergy-proof pillows, mattress covers, and box spring covers.  Wash bed sheets and blankets every week in hot water and dry them in a dryer.  Use blankets that are made of polyester or cotton.  Clean bathrooms and kitchens with bleach. If possible, have someone repaint the walls in these rooms with mold-resistant paint. Keep out of the rooms that are being cleaned and painted.  Wash hands often. Contact a doctor if:  You have make a whistling sound when breaking (wheeze), have shortness of breath, or have a cough even if taking medicine to prevent attacks.  The colored mucus you cough up (sputum) is thicker than usual.  The colored mucus you cough up changes from clear or white to yellow, green, gray, or bloody.  You have problems from the medicine you are taking such as:  A rash.  Itching.  Swelling.  Trouble breathing.  You need reliever medicines more than 2-3 times a week.  Your peak flow measurement is still at 50-79% of your personal best after following the action plan for 1 hour.  You have a fever. Get help right away if:  You seem to be worse and are not responding to medicine during an asthma attack.  You are short of breath even at rest.  You get short of breath when doing very little activity.  You have trouble eating, drinking, or talking.  You have chest pain.  You have a fast heartbeat.  Your lips or fingernails start to turn blue.  You are light-headed, dizzy, or faint.  Your peak flow is less than 50% of your personal best. This information is not intended to replace advice given to you by your health care provider. Make sure you discuss any questions you have with your health care provider. Document Released: 10/19/2007 Document Revised: 10/08/2015 Document Reviewed: 11/29/2012 Elsevier Interactive Patient Education  2017 [employer].

## 2016-10-20 MED FILL — PROVENTIL HFA 108 (90 BASE): 108 (90 BAS | 25 days supply | Qty: 7 | Fill #0 | Status: TO

## 2016-11-03 ENCOUNTER — Ambulatory Visit: Payer: Medicaid Other | Admitting: Family Medicine

## 2016-12-09 ENCOUNTER — Other Ambulatory Visit: Payer: Self-pay | Admitting: Family Medicine

## 2016-12-09 DIAGNOSIS — J4540 Moderate persistent asthma, uncomplicated: Secondary | ICD-10-CM

## 2017-04-20 ENCOUNTER — Encounter (HOSPITAL_COMMUNITY): Payer: Self-pay

## 2017-04-20 ENCOUNTER — Emergency Department (HOSPITAL_COMMUNITY): Payer: Medicaid Other

## 2017-04-20 ENCOUNTER — Emergency Department (HOSPITAL_COMMUNITY)
Admission: EM | Admit: 2017-04-20 | Discharge: 2017-04-20 | Disposition: A | Discharge status: Home / Self Care | Payer: Medicaid Other | Attending: Emergency Medicine | Admitting: Emergency Medicine

## 2017-04-20 DIAGNOSIS — J45901 Unspecified asthma with (acute) exacerbation: Secondary | ICD-10-CM

## 2017-04-20 DIAGNOSIS — J4521 Mild intermittent asthma with (acute) exacerbation: Secondary | ICD-10-CM | POA: Diagnosis not present

## 2017-04-20 DIAGNOSIS — Z79899 Other long term (current) drug therapy: Secondary | ICD-10-CM | POA: Insufficient documentation

## 2017-04-20 DIAGNOSIS — R0602 Shortness of breath: Secondary | ICD-10-CM | POA: Diagnosis present

## 2017-04-20 LAB — CBC
HEMATOCRIT: 45.4 % (ref 39.0–52.0)
HEMOGLOBIN: 15.4 g/dL (ref 13.0–17.0)
MCH: 30 pg (ref 26.0–34.0)
MCHC: 33.9 g/dL (ref 30.0–36.0)
MCV: 88.3 fL (ref 78.0–100.0)
Platelets: 213 10*3/uL (ref 150–400)
RBC: 5.14 MIL/uL (ref 4.22–5.81)
RDW: 13.2 % (ref 11.5–15.5)
WBC: 6.4 10*3/uL (ref 4.0–10.5)

## 2017-04-20 LAB — BASIC METABOLIC PANEL
Anion gap: 13 (ref 5–15)
BUN: 13 mg/dL (ref 6–20)
CO2: 24 mmol/L (ref 22–32)
Calcium: 8.8 mg/dL — ABNORMAL LOW (ref 8.9–10.3)
Chloride: 100 mmol/L — ABNORMAL LOW (ref 101–111)
Creatinine, Ser: 1.27 mg/dL — ABNORMAL HIGH (ref 0.61–1.24)
GFR calc non Af Amer: >60 mL/min (ref >60)
GLUCOSE: 113 mg/dL — AB (ref 65–99)
Potassium: 3.8 mmol/L (ref 3.5–5.1)
Sodium: 137 mmol/L (ref 135–145)

## 2017-04-20 LAB — I-STAT TROPONIN, ED: Troponin i, poc: 0.01 ng/mL (ref 0.00–0.08)

## 2017-04-20 MED ORDER — PREDNISONE 20 MG PO TABS: 40.0000 mg | ORAL_TABLET | Freq: Every day | ORAL | 0 refills | Status: DC

## 2017-04-20 MED ORDER — PREDNISONE 20 MG PO TABS
40.0000 mg | ORAL_TABLET | Freq: Once | ORAL | Status: AC
  Administered 2017-04-20: 40 mg via ORAL
  Filled 2017-04-20: qty 2

## 2017-04-20 MED ORDER — ALBUTEROL SULFATE (2.5 MG/3ML) 0.083% IN NEBU
2.5000 mg | INHALATION_SOLUTION | Freq: Once | RESPIRATORY_TRACT | Status: AC
  Administered 2017-04-20: 2.5 mg via RESPIRATORY_TRACT
  Filled 2017-04-20: qty 3

## 2017-04-20 MED ORDER — AEROCHAMBER PLUS FLO-VU MEDIUM MISC
1.0000 | Freq: Once | Status: DC
  Filled 2017-04-20: qty 1

## 2017-04-20 MED ORDER — ALBUTEROL SULFATE HFA 108 (90 BASE) MCG/ACT IN AERS
2.0000 | INHALATION_SPRAY | RESPIRATORY_TRACT | Status: DC | PRN
Start: 2017-04-20 — End: 2017-04-20
  Administered 2017-04-20: 2 via RESPIRATORY_TRACT
  Filled 2017-04-20: qty 6.7

## 2017-04-20 NOTE — Discharge Instructions (Signed)
DRINK PLENTY OF FLUIDS INCLUDING WATER OR LOW-SUGAR GATORADE/POWERADE. RETURN TO ER IF ANY WORSENING BREATHING PROBLEMS.

## 2017-04-20 NOTE — ED Notes (Signed)
Got patient undress on the monitor patient is resting with family at bedside and call Sobotka in reach 

## 2017-04-20 NOTE — ED Provider Notes (Signed)
[facility] EMERGENCY DEPARTMENT Provider Note   CSN: [id number] Arrival date & time: 04/20/17  0532     History   Chief Complaint Chief Complaint  Patient presents with  . Shortness of Breath  . Chest Pain    HPI William Crane is a 36 y.o. male.  A 36 year old male with history of asthma who presents with shortness of breath, wheezing, and chest tightness.  This morning he woke up with chest tightness and shortness of breath typical of his usual asthma exacerbation.  He has had a few days of cough/cold symptoms.  He had such bad coughing that he had a few episodes of vomiting because of it.  He started to use his albuterol nebulizer but was so short of breath that he decided to come to the ED instead.  He is out of his inhaler.  No sick contacts, fevers, or recent travel.  He feels better since receiving albuterol in the ED.  Patient also notes that he was drinking alcohol last night.   The history is provided by the patient.    Past Medical History:  Diagnosis Date  . Asthma     There are no active problems to display for this patient.   History reviewed. No pertinent surgical history.     Home Medications    Prior to Admission medications   Medication Sig Start Date End Date Taking? Authorizing Provider  amLODipine (NORVASC) 2.5 MG tablet Take 1 tablet (2.5 mg total) by mouth daily. 10/06/16   Lizbeth Bark, FNP  fluticasone (FLONASE) 50 MCG/ACT nasal spray Place 2 sprays into both nostrils daily. 09/08/16   Lizbeth Bark, FNP  fluticasone (FLOVENT HFA) 220 MCG/ACT inhaler Inhale 1 puff into the lungs 2 (two) times daily. 10/06/16   Lizbeth Bark, FNP  hydrochlorothiazide (HYDRODIURIL) 25 MG tablet Take 1 tablet (25 mg total) by mouth daily. 10/06/16   Lizbeth Bark, FNP  ketotifen (ALAWAY) 0.025 % ophthalmic solution Place 1 drop into both eyes 2 (two) times daily. Patient not taking: Reported on 09/22/2016 09/08/16   Lizbeth Bark, FNP  methylPREDNISolone (MEDROL DOSEPAK) 4 MG TBPK tablet follow package directions Patient not taking: Reported on 09/22/2016 08/31/16   Hayden Rasmussen, NP  montelukast (SINGULAIR) 10 MG tablet Take 1 tablet (10 mg total) by mouth at bedtime. 10/06/16   Lizbeth Bark, FNP  oxyCODONE-acetaminophen (PERCOCET) 10-325 MG per tablet Take 1 tablet by mouth every 4 (four) hours as needed for pain.    [provider]  predniSONE (DELTASONE) 20 MG tablet Take 2 tablets (40 mg total) by mouth daily. 04/20/17   Little, Ambrose Finland, MD  PROAIR HFA [phone] Base) MCG/ACT inhaler INHALE 1 PUFF INTO THE LUNGS EVERY 6 HOURS AS NEEDED 12/09/16   Lizbeth Bark, FNP  triamcinolone cream (KENALOG) 0.1 % Apply 1 application topically 2 (two) times daily. 08/31/16   Hayden Rasmussen, NP    Family History No family history on file.  Social History Social History   Tobacco Use  . Smoking status: Never Smoker  . Smokeless tobacco: Never Used  Substance Use Topics  . Alcohol use: Yes  . Drug use: Yes    Types: Marijuana     Allergies   Patient has no known allergies.   Review of Systems Review of Systems All other systems reviewed and are negative except that which was mentioned in HPI   Physical Exam Updated Vital Signs BP (!) 157/84  Pulse 91   Temp 98.6 F (37 C) (Oral)   Resp 18   SpO2 99%   Physical Exam  Constitutional: He is oriented to person, place, and time. He appears well-developed and well-nourished. No distress.  HENT:  Head: Normocephalic and atraumatic.  Moist mucous membranes  Eyes: Conjunctivae are normal.  Neck: Neck supple.  Cardiovascular: Normal rate, regular rhythm and normal heart sounds.  No murmur heard. Pulmonary/Chest: Effort normal. He has wheezes.  Mild inspiratory and expiratory wheezes b/l R>L  Abdominal: Soft. Bowel sounds are normal. He exhibits no distension. There is no tenderness.  Musculoskeletal: He exhibits no edema.    Neurological: He is alert and oriented to person, place, and time.  Fluent speech  Skin: Skin is warm and dry.  Psychiatric: He has a normal mood and affect. Judgment normal.  Nursing note and vitals reviewed.    ED Treatments / Results  Labs (all labs ordered are listed, but only abnormal results are displayed) Labs Reviewed  BASIC METABOLIC PANEL - Abnormal; Notable for the following components:      Result Value   Chloride 100 (*)    Glucose, Bld 113 (*)    Creatinine, Ser 1.27 (*)    Calcium 8.8 (*)    All other components within normal limits  CBC  I-STAT TROPONIN, ED    EKG  EKG Interpretation  Date/Time:  Thursday April 20 2017 05:37:11 EST Ventricular Rate:  117 PR Interval:  126 QRS Duration: 80 QT Interval:  304 QTC Calculation: 424 R Axis:   84 Text Interpretation:  Sinus tachycardia T wave abnormality, consider inferolateral ischemia Abnormal ECG similar to previous Confirmed by Frederick PeersLittle, Rachel [phone]([id number]) on 04/20/2017 9:43:16 AM       Radiology Dg Chest 2 View  Result Date: 04/20/2017 CLINICAL DATA:  36 year old male with chest pain and shortness of breath. EXAM: CHEST  2 VIEW COMPARISON:  Chest radiograph dated 05/27/2016 FINDINGS: The heart size and mediastinal contours are within normal limits. Both lungs are clear. The visualized skeletal structures are unremarkable. IMPRESSION: No active cardiopulmonary disease. Electronically Signed   By: Elgie CollardArash  Radparvar M.D.   On: 04/20/2017 06:28    Procedures Procedures (including critical care time)  Medications Ordered in ED Medications  albuterol (PROVENTIL HFA;VENTOLIN HFA) 108 (90 Base) MCG/ACT inhaler 2 puff (2 puffs Inhalation Given 04/20/17 1040)  AEROCHAMBER PLUS FLO-VU MEDIUM MISC 1 each (not administered)  albuterol (PROVENTIL) (2.5 MG/3ML) 0.083% nebulizer solution 2.5 mg (2.5 mg Nebulization Given 04/20/17 0542)  predniSONE (DELTASONE) tablet 40 mg (40 mg Oral Given 04/20/17 1037)     Initial  Impression / Assessment and Plan / ED Course  I have reviewed the triage vital signs and the nursing notes.  Pertinent labs & imaging results that were available during my care of the patient were reviewed by me and considered in my medical decision making (see chart for details).    Patient with history of asthma presents with his typical asthma exacerbation symptoms in the setting of mild URI symptoms.  O2 saturation normal on room air, normal work of breathing, afebrile.  His symptoms improved after albuterol.  He does have wheezing on exam.  His EKG and labs are reassuring.  He does have very mild AK I which I feel like is related to vomiting episode and drinking alcohol last night.  He is tolerating fluids today and I have emphasized the importance of aggressive hydration.  Regarding his asthma, started a prednisone course and provided with  albuterol to use at home.  Discussed supportive measures and return precautions.  He voiced understanding and was discharged in satisfactory condition. Final Clinical Impressions(s) / ED Diagnoses   Final diagnoses:  Asthma exacerbation, mild    ED Discharge Orders        Ordered    predniSONE (DELTASONE) 20 MG tablet  Daily     04/20/17 1021       Little, Ambrose Finlandachel Morgan, MD 04/20/17 1118

## 2017-04-20 NOTE — ED Triage Notes (Signed)
Pt states that he woke up from his sleep with CP that he thought was heartburn, became SOB, audible wheezing, unrelieved by inhaler. Nausea and vomited x 2

## 2017-07-31 ENCOUNTER — Other Ambulatory Visit: Payer: Self-pay

## 2017-07-31 ENCOUNTER — Encounter (HOSPITAL_COMMUNITY): Payer: Self-pay

## 2017-07-31 ENCOUNTER — Emergency Department (HOSPITAL_COMMUNITY)
Admission: EM | Admit: 2017-07-31 | Discharge: 2017-07-31 | Disposition: A | Discharge status: Home / Self Care | Payer: Medicaid Other | Attending: Emergency Medicine | Admitting: Emergency Medicine

## 2017-07-31 ENCOUNTER — Emergency Department (HOSPITAL_COMMUNITY): Payer: Medicaid Other

## 2017-07-31 DIAGNOSIS — J039 Acute tonsillitis, unspecified: Secondary | ICD-10-CM | POA: Insufficient documentation

## 2017-07-31 DIAGNOSIS — Z79899 Other long term (current) drug therapy: Secondary | ICD-10-CM | POA: Diagnosis not present

## 2017-07-31 DIAGNOSIS — R000 Tachycardia, unspecified: Secondary | ICD-10-CM | POA: Diagnosis not present

## 2017-07-31 DIAGNOSIS — R05 Cough: Secondary | ICD-10-CM | POA: Diagnosis not present

## 2017-07-31 DIAGNOSIS — R509 Fever, unspecified: Secondary | ICD-10-CM | POA: Diagnosis not present

## 2017-07-31 DIAGNOSIS — R599 Enlarged lymph nodes, unspecified: Secondary | ICD-10-CM | POA: Diagnosis not present

## 2017-07-31 DIAGNOSIS — J40 Bronchitis, not specified as acute or chronic: Secondary | ICD-10-CM | POA: Diagnosis not present

## 2017-07-31 DIAGNOSIS — J45909 Unspecified asthma, uncomplicated: Secondary | ICD-10-CM | POA: Insufficient documentation

## 2017-07-31 DIAGNOSIS — K5900 Constipation, unspecified: Secondary | ICD-10-CM | POA: Diagnosis not present

## 2017-07-31 DIAGNOSIS — Z209 Contact with and (suspected) exposure to unspecified communicable disease: Secondary | ICD-10-CM | POA: Insufficient documentation

## 2017-07-31 DIAGNOSIS — J029 Acute pharyngitis, unspecified: Secondary | ICD-10-CM | POA: Diagnosis present

## 2017-07-31 LAB — RAPID STREP SCREEN (MED CTR MEBANE ONLY): STREPTOCOCCUS, GROUP A SCREEN (DIRECT): NEGATIVE

## 2017-07-31 MED ORDER — ALBUTEROL SULFATE HFA 108 (90 BASE) MCG/ACT IN AERS
2.0000 | INHALATION_SPRAY | RESPIRATORY_TRACT | Status: DC | PRN
  Administered 2017-07-31: 2 via RESPIRATORY_TRACT
  Filled 2017-07-31: qty 6.7

## 2017-07-31 MED ORDER — ACETAMINOPHEN 325 MG PO TABS
650.0000 mg | ORAL_TABLET | Freq: Once | ORAL | Status: AC | PRN
  Administered 2017-07-31: 650 mg via ORAL
  Filled 2017-07-31: qty 2

## 2017-07-31 MED ORDER — ALBUTEROL SULFATE (2.5 MG/3ML) 0.083% IN NEBU
5.0000 mg | INHALATION_SOLUTION | Freq: Once | RESPIRATORY_TRACT | Status: DC
  Filled 2017-07-31: qty 6

## 2017-07-31 MED ORDER — PREDNISONE 10 MG (21) PO TBPK: ORAL_TABLET | ORAL | 0 refills | Status: DC

## 2017-07-31 MED ORDER — IPRATROPIUM-ALBUTEROL 0.5-2.5 (3) MG/3ML IN SOLN
3.0000 mL | Freq: Once | RESPIRATORY_TRACT | Status: AC
  Administered 2017-07-31: 3 mL via RESPIRATORY_TRACT
  Filled 2017-07-31: qty 3

## 2017-07-31 MED ORDER — AMOXICILLIN 500 MG PO CAPS: 500.0000 mg | ORAL_CAPSULE | Freq: Three times a day (TID) | ORAL | 0 refills | Status: DC

## 2017-07-31 MED ORDER — BENZONATATE 100 MG PO CAPS: 100.0000 mg | ORAL_CAPSULE | Freq: Three times a day (TID) | ORAL | 0 refills | Status: DC

## 2017-07-31 MED ORDER — DEXAMETHASONE 4 MG PO TABS
10.0000 mg | ORAL_TABLET | Freq: Once | ORAL | Status: AC
  Administered 2017-07-31: 10 mg via ORAL
  Filled 2017-07-31: qty 2

## 2017-07-31 MED ORDER — DEXAMETHASONE SODIUM PHOSPHATE 10 MG/ML IJ SOLN: 10.0000 mg | Freq: Once | INTRAMUSCULAR | Status: DC

## 2017-07-31 NOTE — ED Notes (Signed)
Bed: WTR6 Expected date:  Expected time:  Means of arrival:  Comments: 

## 2017-07-31 NOTE — ED Triage Notes (Addendum)
Pt arrives today c/o productive cough x1 week, chest wall pain with cough x 4 days, throat pain x1 day, and fever currently. Pt's child was recently dx with strep throat. Pt states he is also out of his inhalers, hx of asthma. Pt also c/o inability to have a bowel movement x1 week, despite laxative last night. Pt reports having a PCP, but did not have any appointments available soon enough. Has not taken any OTC medications, etc since last night. Pt speaking in full sentences in triage.

## 2017-07-31 NOTE — ED Provider Notes (Signed)
[facility]-EMERGENCY DEPT Provider Note   CSN: [id number] Arrival date & time: 07/31/17  1042     History   Chief Complaint Chief Complaint  Patient presents with  . Sore Throat  . Cough  . Fever    HPI William Crane is a 37 y.o. male with hx of asthma and chronic back pain who presents to the ED with productive cough x 2 week, chest wall pain. Sore throat x 2 days, fever x 1 day. Patient's child was dx with strep throat 3 days ago . Patient also request refill on inhaler. Patient reports problem with constipation the past week but has not been drinking water and has been eating fast foods and taking Percocet for his back pain.   HPI  Past Medical History:  Diagnosis Date  . Asthma     There are no active problems to display for this patient.   History reviewed. No pertinent surgical history.     Home Medications    Prior to Admission medications   Medication Sig Start Date End Date Taking? Authorizing Provider  amLODipine (NORVASC) 2.5 MG tablet Take 1 tablet (2.5 mg total) by mouth daily. 10/06/16   Lizbeth Bark, FNP  amoxicillin (AMOXIL) 500 MG capsule Take 1 capsule (500 mg total) by mouth 3 (three) times daily. 07/31/17   Janne Napoleon, NP  benzonatate (TESSALON) 100 MG capsule Take 1 capsule (100 mg total) by mouth every 8 (eight) hours. 07/31/17   Janne Napoleon, NP  fluticasone (FLONASE) 50 MCG/ACT nasal spray Place 2 sprays into both nostrils daily. 09/08/16   Lizbeth Bark, FNP  fluticasone (FLOVENT HFA) 220 MCG/ACT inhaler Inhale 1 puff into the lungs 2 (two) times daily. 10/06/16   Lizbeth Bark, FNP  hydrochlorothiazide (HYDRODIURIL) 25 MG tablet Take 1 tablet (25 mg total) by mouth daily. 10/06/16   Lizbeth Bark, FNP  ketotifen (ALAWAY) 0.025 % ophthalmic solution Place 1 drop into both eyes 2 (two) times daily. Patient not taking: Reported on 09/22/2016 09/08/16   Lizbeth Bark, FNP  methylPREDNISolone  (MEDROL DOSEPAK) 4 MG TBPK tablet follow package directions Patient not taking: Reported on 09/22/2016 08/31/16   Hayden Rasmussen, NP  montelukast (SINGULAIR) 10 MG tablet Take 1 tablet (10 mg total) by mouth at bedtime. 10/06/16   Lizbeth Bark, FNP  oxyCODONE-acetaminophen (PERCOCET) 10-325 MG per tablet Take 1 tablet by mouth every 4 (four) hours as needed for pain.    [provider]  predniSONE (STERAPRED UNI-PAK 21 TAB) 10 MG (21) TBPK tablet Starting 08/01/2017 take 6 tablets day one then 5, 4, 3, 2, 1 07/31/17   Neese, Levering, NP  PROAIR HFA 108 (90 Base) MCG/ACT inhaler INHALE 1 PUFF INTO THE LUNGS EVERY 6 HOURS AS NEEDED 12/09/16   Lizbeth Bark, FNP  triamcinolone cream (KENALOG) 0.1 % Apply 1 application topically 2 (two) times daily. 08/31/16   Hayden Rasmussen, NP    Family History History reviewed. No pertinent family history.  Social History Social History   Tobacco Use  . Smoking status: Never Smoker  . Smokeless tobacco: Never Used  Substance Use Topics  . Alcohol use: Yes  . Drug use: Yes    Types: Marijuana     Allergies   Patient has no known allergies.   Review of Systems Review of Systems  Constitutional: Positive for chills and fever.  HENT: Positive for congestion and sore throat. Negative for trouble swallowing.   Eyes:  Negative for discharge and redness.  Respiratory: Positive for cough and wheezing.   Cardiovascular: Chest pain: with cough only.  Gastrointestinal: Positive for constipation. Negative for abdominal pain, nausea and vomiting.  Genitourinary: Positive for decreased urine volume.  Musculoskeletal: Positive for myalgias.  Skin: Negative for rash.  Neurological: Positive for headaches. Negative for syncope.  Hematological: Positive for adenopathy.  Psychiatric/Behavioral: Negative for confusion.     Physical Exam Updated Vital Signs BP (!) 140/94 (BP Location: Left Arm)   Pulse 100   Temp 99.9 F (37.7 C) (Oral)   Resp  18   Ht 5\' 10"  (1.778 m)   Wt (!) 145.2 kg (320 lb)   SpO2 99%   BMI 45.92 kg/m   Physical Exam  Constitutional: He appears well-developed and well-nourished. No distress.  HENT:  Head: Normocephalic.  Right Ear: Tympanic membrane normal.  Left Ear: Tympanic membrane normal.  Nose: Mucosal edema present.  Mouth/Throat: Uvula is midline. Oropharyngeal exudate and posterior oropharyngeal erythema present. No tonsillar abscesses. Tonsils are 2+ on the right. Tonsils are 2+ on the left. Tonsillar exudate (bilateral).  Eyes: Conjunctivae and EOM are normal.  Neck: Neck supple.  Cardiovascular: Regular rhythm. Tachycardia present.  Pulmonary/Chest: Effort normal. He has wheezes.  Wheezing bilateral  Abdominal: There is no tenderness.  Musculoskeletal: Normal range of motion.  Neurological: He is alert.  Skin: Skin is warm and dry.  Psychiatric: He has a normal mood and affect.  Nursing note and vitals reviewed.    ED Treatments / Results  Labs (all labs ordered are listed, but only abnormal results are displayed) Labs Reviewed  RAPID STREP SCREEN (NOT [facility])  CULTURE, GROUP A STREP [facility])    Radiology Dg Chest 2 View  Result Date: 07/31/2017 CLINICAL DATA:  Productive cough for 1 week. Chest wall pain with coughing for 4 days. EXAM: CHEST - 2 VIEW COMPARISON:  PA and lateral chest 04/20/2017 and 03/17/2017. FINDINGS: The lungs are clear. There is some peribronchial thickening. No pneumothorax or pleural effusion. Heart size is normal. No bony abnormality. IMPRESSION: Peribronchial thickening compatible with bronchitis. No focal abnormality. Electronically Signed   By: Drusilla Kannerhomas  Dalessio M.D.   On: 07/31/2017 12:55    Procedures Procedures (including critical care time)  Medications Ordered in ED Medications  albuterol (PROVENTIL) (2.5 MG/3ML) 0.083% nebulizer solution 5 mg (5 mg Nebulization Not Given 07/31/17 1241)  albuterol (PROVENTIL HFA;VENTOLIN HFA) 108 (90 Base)  MCG/ACT inhaler 2 puff (2 puffs Inhalation Given 07/31/17 1355)  acetaminophen (TYLENOL) tablet 650 mg (650 mg Oral Given 07/31/17 1147)  ipratropium-albuterol (DUONEB) 0.5-2.5 (3) MG/3ML nebulizer solution 3 mL (3 mLs Nebulization Given 07/31/17 1225)  dexamethasone (DECADRON) tablet 10 mg (10 mg Oral Given 07/31/17 1327)   Patient re-examined after neb treatment, now only occasional wheezing. Patient reports feeling better.   Initial Impression / Assessment and Plan / ED Course  I have reviewed the triage vital signs and the nursing notes. 37 y.o. male with no current signs of respiratory distress. Lung exam improved after nebulizer treatment. Prednisone given in the ED and pt will be discharged with tapert. Pt states they are breathing at baseline. Pt has been instructed to continue using prescribed medications and to speak with PCP about today's exacerbation.   Patient with child dx with strep 3 days ago. Pt febrile with tonsillar exudate, cervical lymphadenopathy, & dysphagia; diagnosis of bacterial pharyngitis/tonsilllitis. Treated in the ED with steroids. Rx antibiotics Pt appears mildly dehydrated, discussed importance of water rehydration. Presentation  non concerning for PTA or RPA. No trismus or uvula deviation. Specific return precautions discussed. Pt able to drink water in ED without difficulty with intact air way. Recommended PCP follow up.   Final Clinical Impressions(s) / ED Diagnoses   Final diagnoses:  Tonsillitis  Bronchitis  Constipation, unspecified constipation type    ED Discharge Orders        Ordered    amoxicillin (AMOXIL) 500 MG capsule  3 times daily     07/31/17 1334    benzonatate (TESSALON) 100 MG capsule  Every 8 hours     07/31/17 1334    predniSONE (STERAPRED UNI-PAK 21 TAB) 10 MG (21) TBPK tablet     07/31/17 1334       Damian Leavell Van Voorhis, NP 07/31/17 1401    Derwood Kaplan, MD 07/31/17 1738

## 2017-07-31 NOTE — Discharge Instructions (Signed)
Follow up with your doctor.  Return here as needed 

## 2017-08-02 LAB — CULTURE, GROUP A STREP (THRC)

## 2017-08-03 ENCOUNTER — Telehealth: Payer: Self-pay | Admitting: *Deleted

## 2017-08-03 NOTE — Telephone Encounter (Signed)
Post ED Visit - Positive Culture Follow-up  Culture report reviewed by antimicrobial stewardship pharmacist:  []  Enzo BiNathan Batchelder, Pharm.D. []  Celedo nio MiyamotoJeremy Frens, Pharm.D., BCPS AQ-ID []  Garvin FilaMike Maccia, Pharm.D., BCPS [x]  Georgina PillionElizabeth Martin, Pharm.D., BCPS []  TupmanMinh Pham, [address] BoulevardPharm.D., BCPS, AAHIVP []  Estella HuskMichelle Turner, Pharm.D., BCPS, AAHIVP []  Lysle Pearlachel Rumbarger, PharmD, BCPS []  Blake DivineShannon Parkey, PharmD []  Pollyann SamplesAndy Johnston, PharmD, BCPS  Positive strep culture Treated with Amoxicillin , organism sensitive to the same and no further patient follow-up is required at this time.  Virl AxeRobertson, Lexxie Winberg [facility] 08/03/2017, 10:06 AM

## 2017-09-27 ENCOUNTER — Ambulatory Visit (HOSPITAL_COMMUNITY)
Admission: EM | Admit: 2017-09-27 | Discharge: 2017-09-27 | Disposition: A | Discharge status: Home / Self Care | Payer: Medicaid Other | Attending: Family Medicine | Admitting: Family Medicine

## 2017-09-27 ENCOUNTER — Encounter (HOSPITAL_COMMUNITY): Payer: Self-pay | Admitting: Emergency Medicine

## 2017-09-27 ENCOUNTER — Other Ambulatory Visit: Payer: Self-pay

## 2017-09-27 DIAGNOSIS — J029 Acute pharyngitis, unspecified: Secondary | ICD-10-CM | POA: Diagnosis not present

## 2017-09-27 DIAGNOSIS — J4540 Moderate persistent asthma, uncomplicated: Secondary | ICD-10-CM

## 2017-09-27 DIAGNOSIS — J40 Bronchitis, not specified as acute or chronic: Secondary | ICD-10-CM

## 2017-09-27 MED ORDER — FLUTICASONE PROPIONATE 50 MCG/ACT NA SUSP: 2.0000 | Freq: Every day | NASAL | 0 refills | Status: AC

## 2017-09-27 MED ORDER — PREDNISONE 50 MG PO TABS: 50.0000 mg | ORAL_TABLET | Freq: Every day | ORAL | 0 refills | Status: AC

## 2017-09-27 MED ORDER — IPRATROPIUM BROMIDE 0.06 % NA SOLN: 2.0000 | Freq: Four times a day (QID) | NASAL | 0 refills | Status: AC

## 2017-09-27 MED ORDER — AZITHROMYCIN 250 MG PO TABS: 250.0000 mg | ORAL_TABLET | Freq: Every day | ORAL | 0 refills | Status: AC

## 2017-09-27 MED ORDER — IPRATROPIUM-ALBUTEROL 0.5-2.5 (3) MG/3ML IN SOLN
RESPIRATORY_TRACT | Status: AC
  Filled 2017-09-27: qty 3

## 2017-09-27 MED ORDER — ALBUTEROL SULFATE HFA 108 (90 BASE) MCG/ACT IN AERS
INHALATION_SPRAY | RESPIRATORY_TRACT | Status: AC
  Filled 2017-09-27: qty 6.7

## 2017-09-27 MED ORDER — ALBUTEROL SULFATE HFA 108 (90 BASE) MCG/ACT IN AERS: 1.0000 | INHALATION_SPRAY | Freq: Four times a day (QID) | RESPIRATORY_TRACT | 0 refills | Status: AC | PRN

## 2017-09-27 MED ORDER — ALBUTEROL SULFATE HFA 108 (90 BASE) MCG/ACT IN AERS
2.0000 | INHALATION_SPRAY | Freq: Once | RESPIRATORY_TRACT | Status: AC
  Administered 2017-09-27: 2 via RESPIRATORY_TRACT

## 2017-09-27 MED ORDER — IPRATROPIUM-ALBUTEROL 0.5-2.5 (3) MG/3ML IN SOLN
3.0000 mL | Freq: Once | RESPIRATORY_TRACT | Status: AC
  Administered 2017-09-27: 3 mL via RESPIRATORY_TRACT

## 2017-09-27 NOTE — Discharge Instructions (Signed)
Start prednisone and azithromycin for bronchitis and asthma exacerbation. Albuterol as needed for wheezing/shortness of breath. Start flonase, atrovent nasal sprayfor nasal congestion/drainage. You can use over the counter nasal saline rinse such as neti pot for nasal congestion. Keep hydrated, your urine should be clear to pale yellow in color. Tylenol/motrin for fever and pain. Monitor for any worsening of symptoms, chest pain, shortness of breath, wheezing, swelling of the throat, follow up for reevaluation.   For sore throat try using a honey-based tea. Use 3 teaspoons of honey with juice squeezed from half lemon. Place shaved pieces of ginger into 1/2-1 cup of water and warm over stove top. Then mix the ingredients and repeat every 4 hours as needed.  As discussed, you'll need to follow up with PCP for further management of asthma, as this could be uncontrolled after stopping flovent.

## 2017-09-27 NOTE — ED Provider Notes (Signed)
[facility]    CSN: [id number] Arrival date & time: 09/27/17  1019     History   Chief Complaint Chief Complaint  Patient presents with  . Sore Throat  . Cough    HPI William Crane is a 37 y.o. male.   37 year old male with history of asthma comes in for 1 month history of URI symptoms. States he has had productive cough for the past month, then started having sore throat and nasal congestion for the past 2 days. He has had to increase his use of albuterol inhaler for the past month due to the cough. He denies fever, chills, night sweats. otc cold medicine without relief. He self discontinued his flovent about 1.5 months ago.      Past Medical History:  Diagnosis Date  . Asthma     There are no active problems to display for this patient.   History reviewed. No pertinent surgical history.     Home Medications    Prior to Admission medications   Medication Sig Start Date End Date Taking? Authorizing Provider  albuterol (PROVENTIL HFA;VENTOLIN HFA) 108 (90 Base) MCG/ACT inhaler Inhale 1-2 puffs into the lungs every 6 (six) hours as needed for wheezing or shortness of breath. 09/27/17   Cathie Hoops, Oluwaseun Bruyere V, PA-C  amLODipine (NORVASC) 2.5 MG tablet Take 1 tablet (2.5 mg total) by mouth daily. 10/06/16   Lizbeth Bark, FNP  azithromycin (ZITHROMAX) 250 MG tablet Take 1 tablet (250 mg total) by mouth daily. Take first 2 tablets together, then 1 every day until finished. 09/27/17   Cathie Hoops, Reathel Turi V, PA-C  fluticasone (FLONASE) 50 MCG/ACT nasal spray Place 2 sprays into both nostrils daily. 09/27/17   Cathie Hoops, Verlia Kaney V, PA-C  fluticasone (FLOVENT HFA) 220 MCG/ACT inhaler Inhale 1 puff into the lungs 2 (two) times daily. 10/06/16   Lizbeth Bark, FNP  hydrochlorothiazide (HYDRODIURIL) 25 MG tablet Take 1 tablet (25 mg total) by mouth daily. 10/06/16   Lizbeth Bark, FNP  ipratropium (ATROVENT) 0.06 % nasal spray Place 2 sprays into both nostrils 4 (four) times daily. 09/27/17    Cathie Hoops, Milinda Sweeney V, PA-C  montelukast (SINGULAIR) 10 MG tablet Take 1 tablet (10 mg total) by mouth at bedtime. 10/06/16   Lizbeth Bark, FNP  oxyCODONE-acetaminophen (PERCOCET) 10-325 MG per tablet Take 1 tablet by mouth every 4 (four) hours as needed for pain.    [provider]  predniSONE (DELTASONE) 50 MG tablet Take 1 tablet (50 mg total) by mouth daily for 5 days. 09/27/17 10/02/17  Cathie Hoops, Ajayla Iglesias V, PA-C  triamcinolone cream (KENALOG) 0.1 % Apply 1 application topically 2 (two) times daily. 08/31/16   Hayden Rasmussen, NP    Family History History reviewed. No pertinent family history.  Social History Social History   Tobacco Use  . Smoking status: Never Smoker  . Smokeless tobacco: Never Used  Substance Use Topics  . Alcohol use: Yes  . Drug use: Yes    Types: Marijuana     Allergies   Patient has no known allergies.   Review of Systems Review of Systems  Reason unable to perform ROS: See HPI as above.     Physical Exam Triage Vital Signs ED Triage Vitals  Enc Vitals Group     BP 09/27/17 1045 (!) 156/91     Pulse Rate 09/27/17 1045 84     Resp 09/27/17 1045 20     Temp 09/27/17 1045 98.5 F (36.9 C)  Temp Source 09/27/17 1045 Oral     SpO2 09/27/17 1045 95 %     Weight --      Height --      Head Circumference --      Peak Flow --      Pain Score 09/27/17 1044 8     Pain Loc --      Pain Edu? --      Excl. in GC? --    No data found.  Updated Vital Signs BP (!) 156/91 (BP Location: Left Arm)   Pulse 84   Temp 98.5 F (36.9 C) (Oral)   Resp 20   SpO2 95%   Physical Exam  Constitutional: He is oriented to person, place, and time. He appears well-developed and well-nourished.  Non-toxic appearance. He does not appear ill. No distress.  HENT:  Head: Normocephalic and atraumatic.  Right Ear: Tympanic membrane, external ear and ear canal normal. Tympanic membrane is not erythematous and not bulging.  Left Ear: Tympanic membrane, external ear and ear  canal normal. Tympanic membrane is not erythematous and not bulging.  Nose: Mucosal edema and rhinorrhea present. Right sinus exhibits no maxillary sinus tenderness and no frontal sinus tenderness. Left sinus exhibits no maxillary sinus tenderness and no frontal sinus tenderness.  Mouth/Throat: Uvula is midline, oropharynx is clear and moist and mucous membranes are normal.  Eyes: Pupils are equal, round, and reactive to light. Conjunctivae are normal.  Neck: Normal range of motion. Neck supple.  Cardiovascular: Normal rate, regular rhythm and normal heart sounds. Exam reveals no gallop and no friction rub.  No murmur heard. Pulmonary/Chest: Effort normal. No accessory muscle usage. No respiratory distress.  Decreased air movement. No obvious adventitious lung sounds.   Lymphadenopathy:    He has no cervical adenopathy.  Neurological: He is alert and oriented to person, place, and time.  Skin: Skin is warm and dry. He is not diaphoretic.  Psychiatric: He has a normal mood and affect. His behavior is normal. Judgment normal.     UC Treatments / Results  Labs (all labs ordered are listed, but only abnormal results are displayed) Labs Reviewed - No data to display  EKG None  Radiology No results found.  Procedures Procedures (including critical care time)  Medications Ordered in UC Medications  ipratropium-albuterol (DUONEB) 0.5-2.5 (3) MG/3ML nebulizer solution 3 mL (3 mLs Nebulization Given 09/27/17 1140)  albuterol (PROVENTIL HFA;VENTOLIN HFA) 108 (90 Base) MCG/ACT inhaler 2 puff (2 puffs Inhalation Given 09/27/17 1202)    Initial Impression / Assessment and Plan / UC Course  I have reviewed the triage vital signs and the nursing notes.  Pertinent labs & imaging results that were available during my care of the patient were reviewed by me and considered in my medical decision making (see chart for details).    Patient with better air movement after DuoNeb.  Patient to start  prednisone and azithromycin for bronchitis/asthma exacerbation.  Albuterol inhaler as directed.  Symptomatic treatment discussed.  Discussed with patient, given he self discontinued his Flovent shortly before current symptom onset,  he may need to restart maintenance inhaler.  Patient to follow-up with his PCP for further evaluation and management needed.  Return precautions given.  Patient expresses understanding and agrees to plan.  Final Clinical Impressions(s) / UC Diagnoses   Final diagnoses:  Bronchitis    ED Prescriptions    Medication Sig Dispense Auth. Provider   predniSONE (DELTASONE) 50 MG tablet Take 1 tablet (50 mg total)  by mouth daily for 5 days. 5 tablet Kalib Bhagat V, PA-C   albuterol (PROVENTIL HFA;VENTOLIN HFA) 108 (90 Base) MCG/ACT inhaler Inhale 1-2 puffs into the lungs every 6 (six) hours as needed for wheezing or shortness of breath. 1 Inhaler Kelyn Koskela V, PA-C   azithromycin (ZITHROMAX) 250 MG tablet Take 1 tablet (250 mg total) by mouth daily. Take first 2 tablets together, then 1 every day until finished. 6 tablet Ziona Wickens V, PA-C   ipratropium (ATROVENT) 0.06 % nasal spray Place 2 sprays into both nostrils 4 (four) times daily. 15 mL Kamarie Palma V, PA-C   fluticasone (FLONASE) 50 MCG/ACT nasal spray Place 2 sprays into both nostrils daily. 1 g Threasa Alpha, [place] 09/27/17 [phone]

## 2017-09-27 NOTE — ED Triage Notes (Signed)
The patient presented to the [facility] with a complaint of a cough x 1 month and a sore throat and nasal drainage that started yesterday.

## 2022-02-14 ENCOUNTER — Emergency Department (HOSPITAL_COMMUNITY): Payer: Medicaid Other

## 2022-02-14 ENCOUNTER — Encounter (HOSPITAL_COMMUNITY): Payer: Self-pay

## 2022-02-14 ENCOUNTER — Emergency Department (HOSPITAL_COMMUNITY)
Admission: EM | Admit: 2022-02-14 | Discharge: 2022-02-14 | Discharge status: Against Medical Advice | Payer: Medicaid Other | Attending: Emergency Medicine | Admitting: Emergency Medicine

## 2022-02-14 ENCOUNTER — Other Ambulatory Visit: Payer: Self-pay

## 2022-02-14 DIAGNOSIS — Z5321 Procedure and treatment not carried out due to patient leaving prior to being seen by health care provider: Secondary | ICD-10-CM | POA: Insufficient documentation

## 2022-02-14 DIAGNOSIS — R079 Chest pain, unspecified: Secondary | ICD-10-CM | POA: Diagnosis not present

## 2022-02-14 DIAGNOSIS — J45909 Unspecified asthma, uncomplicated: Secondary | ICD-10-CM | POA: Insufficient documentation

## 2022-02-14 DIAGNOSIS — R0602 Shortness of breath: Secondary | ICD-10-CM | POA: Insufficient documentation

## 2022-02-14 LAB — BASIC METABOLIC PANEL
Anion gap: 8 (ref 5–15)
BUN: 13 mg/dL (ref 6–20)
CO2: 23 mmol/L (ref 22–32)
Calcium: 8.6 mg/dL — ABNORMAL LOW (ref 8.9–10.3)
Chloride: 106 mmol/L (ref 98–111)
Creatinine, Ser: 1.27 mg/dL — ABNORMAL HIGH (ref 0.61–1.24)
GFR, Estimated: >60 mL/min (ref >60)
Glucose, Bld: 103 mg/dL — ABNORMAL HIGH (ref 70–99)
Potassium: 3.8 mmol/L (ref 3.5–5.1)
Sodium: 137 mmol/L (ref 135–145)

## 2022-02-14 LAB — CBC
HCT: 46.4 % (ref 39.0–52.0)
Hemoglobin: 15.9 g/dL (ref 13.0–17.0)
MCH: 30.7 pg (ref 26.0–34.0)
MCHC: 34.3 g/dL (ref 30.0–36.0)
MCV: 89.6 fL (ref 80.0–100.0)
Platelets: 218 10*3/uL (ref 150–400)
RBC: 5.18 MIL/uL (ref 4.22–5.81)
RDW: 13.1 % (ref 11.5–15.5)
WBC: 7.3 10*3/uL (ref 4.0–10.5)
nRBC: 0 % (ref 0.0–0.2)

## 2022-02-14 LAB — TROPONIN I (HIGH SENSITIVITY): Troponin I (High Sensitivity): 5 ng/L (ref <18)

## 2022-02-14 MED ORDER — ALBUTEROL SULFATE HFA 108 (90 BASE) MCG/ACT IN AERS
2.0000 | INHALATION_SPRAY | RESPIRATORY_TRACT | Status: DC | PRN
  Administered 2022-02-14: 2 via RESPIRATORY_TRACT
  Filled 2022-02-14: qty 6.7

## 2022-02-14 MED ORDER — IPRATROPIUM-ALBUTEROL 0.5-2.5 (3) MG/3ML IN SOLN
3.0000 mL | RESPIRATORY_TRACT | Status: AC
  Administered 2022-02-14 (×3): 3 mL via RESPIRATORY_TRACT
  Filled 2022-02-14 (×3): qty 3

## 2022-02-14 MED ORDER — METHYLPREDNISOLONE SODIUM SUCC 125 MG IJ SOLR
125.0000 mg | Freq: Once | INTRAMUSCULAR | Status: AC
  Administered 2022-02-14: 125 mg via INTRAVENOUS
  Filled 2022-02-14: qty 2

## 2022-02-14 NOTE — ED Triage Notes (Signed)
Patient reports he is having an asthma attack and short of breath. Took a few breathing treatments at home with no relief.

## 2022-02-14 NOTE — ED Notes (Signed)
Patient left AMA.

## 2022-02-14 NOTE — ED Notes (Signed)
Pt stated he wanted to leave. IV removed

## 2022-02-14 NOTE — ED Provider Triage Note (Signed)
Emergency Medicine Provider Triage Evaluation Note  William Crane , a 41 y.o. male  was evaluated in triage.  Pt complains of asthma, shortness of breath. States that same has been ongoing since this morning. States that he has tried his home inhalers without relief. States that he is having chest pain as well. He states that he does have history of requiring hospital admission for asthma exacerbation.  Review of Systems  Positive:  Negative:   Physical Exam  BP (!) 186/116 (BP Location: Left Arm)   Pulse (!) 114   Temp 98 F (36.7 C) (Oral)   Resp (!) 25   Ht 5\' 10"  (1.778 m)   Wt 136.1 kg   SpO2 95%   BMI 43.05 kg/m  Gen:   Awake, no distress   Resp:  Tachypnea, tachycardia, diffuse wheezing MSK:   Moves extremities without difficulty  Other:    Medical Decision Making  Medically screening exam initiated at 8:52 PM.  Appropriate orders placed.  William Crane was informed that the remainder of the evaluation will be completed by another provider, this initial triage assessment does not replace that evaluation, and the importance of remaining in the ED until their evaluation is complete.  Staff aware patient needs room soon   Nestor Lewandowsky 02/14/22 2316

## 2024-02-21 ENCOUNTER — Encounter (HOSPITAL_COMMUNITY): Payer: Self-pay

## 2024-02-21 ENCOUNTER — Emergency Department (HOSPITAL_COMMUNITY)
Admission: EM | Admit: 2024-02-21 | Discharge: 2024-02-21 | Discharge status: Against Medical Advice | Attending: Emergency Medicine | Admitting: Emergency Medicine

## 2024-02-21 ENCOUNTER — Other Ambulatory Visit: Payer: Self-pay

## 2024-02-21 ENCOUNTER — Emergency Department (HOSPITAL_COMMUNITY)

## 2024-02-21 DIAGNOSIS — Z5321 Procedure and treatment not carried out due to patient leaving prior to being seen by health care provider: Secondary | ICD-10-CM | POA: Insufficient documentation

## 2024-02-21 DIAGNOSIS — R0609 Other forms of dyspnea: Secondary | ICD-10-CM | POA: Diagnosis present

## 2024-02-21 DIAGNOSIS — J45909 Unspecified asthma, uncomplicated: Secondary | ICD-10-CM | POA: Diagnosis not present

## 2024-02-21 LAB — CBC WITH DIFFERENTIAL/PLATELET
Abs Immature Granulocytes: 0.06 K/uL (ref 0.00–0.07)
Basophils Absolute: 0 K/uL (ref 0.0–0.1)
Basophils Relative: 1 %
Eosinophils Absolute: 0.4 K/uL (ref 0.0–0.5)
Eosinophils Relative: 6 %
HCT: 44.3 % (ref 39.0–52.0)
Hemoglobin: 15 g/dL (ref 13.0–17.0)
Immature Granulocytes: 1 %
Lymphocytes Relative: 23 %
Lymphs Abs: 1.6 K/uL (ref 0.7–4.0)
MCH: 30.7 pg (ref 26.0–34.0)
MCHC: 33.9 g/dL (ref 30.0–36.0)
MCV: 90.6 fL (ref 80.0–100.0)
Monocytes Absolute: 0.5 K/uL (ref 0.1–1.0)
Monocytes Relative: 6 %
Neutro Abs: 4.5 K/uL (ref 1.7–7.7)
Neutrophils Relative %: 63 %
Platelets: 231 K/uL (ref 150–400)
RBC: 4.89 MIL/uL (ref 4.22–5.81)
RDW: 13.2 % (ref 11.5–15.5)
WBC: 7.1 K/uL (ref 4.0–10.5)
nRBC: 0 % (ref 0.0–0.2)

## 2024-02-21 LAB — BASIC METABOLIC PANEL WITH GFR
Anion gap: 10 (ref 5–15)
BUN: 12 mg/dL (ref 6–20)
CO2: 24 mmol/L (ref 22–32)
Calcium: 8.8 mg/dL — ABNORMAL LOW (ref 8.9–10.3)
Chloride: 102 mmol/L (ref 98–111)
Creatinine, Ser: 1.13 mg/dL (ref 0.61–1.24)
GFR, Estimated: >60 mL/min (ref >60)
Glucose, Bld: 123 mg/dL — ABNORMAL HIGH (ref 70–99)
Potassium: 3.9 mmol/L (ref 3.5–5.1)
Sodium: 136 mmol/L (ref 135–145)

## 2024-02-21 LAB — MAGNESIUM: Magnesium: 1.8 mg/dL (ref 1.7–2.4)

## 2024-02-21 MED ORDER — IPRATROPIUM-ALBUTEROL 0.5-2.5 (3) MG/3ML IN SOLN
3.0000 mL | Freq: Once | RESPIRATORY_TRACT | Status: AC
Start: 2024-02-21 — End: 2024-02-21
  Administered 2024-02-21: 3 mL via RESPIRATORY_TRACT
  Filled 2024-02-21: qty 3

## 2024-02-21 MED ORDER — ALBUTEROL SULFATE (2.5 MG/3ML) 0.083% IN NEBU
2.5000 mg | INHALATION_SOLUTION | Freq: Once | RESPIRATORY_TRACT | Status: AC
  Administered 2024-02-21: 2.5 mg via RESPIRATORY_TRACT

## 2024-02-21 MED ORDER — IPRATROPIUM-ALBUTEROL 0.5-2.5 (3) MG/3ML IN SOLN
3.0000 mL | Freq: Once | RESPIRATORY_TRACT | Status: AC
  Administered 2024-02-21: 3 mL via RESPIRATORY_TRACT
  Filled 2024-02-21: qty 3

## 2024-02-21 NOTE — ED Provider Triage Note (Signed)
 Emergency Medicine Provider Triage Evaluation Note  William Crane , a 43 y.o. male  was evaluated in triage.  Pt complains of asthma attack for the past 2 days.  Significant shortness of breath with exertion.  States his albuterol  inhaler is not helping.  Decreased air movement on exam.  No acute distress noted.  Breathing treatment ordered.  Review of Systems  Positive: As above Negative: As above  Physical Exam  BP (!) 161/87 (BP Location: Right Arm)   Pulse (!) 109   Temp 98.6 F (37 C)   Resp (!) 24   Ht 5' 10 (1.778 m)   Wt (!) 139.3 kg   SpO2 93%   BMI 44.05 kg/m  Gen:   Awake, no distress   Resp:  Normal effort  MSK:   Moves extremities without difficulty  Other:  Decreased air movement  Medical Decision Making  Medically screening exam initiated at 1:23 PM.  Appropriate orders placed.  Abisai Deer was informed that the remainder of the evaluation will be completed by another provider, this initial triage assessment does not replace that evaluation, and the importance of remaining in the ED until their evaluation is complete.     Hildegard Loge, PA-C 02/21/24 1414

## 2024-02-21 NOTE — ED Notes (Signed)
 Pt with worsening shob. EDPA notified. Per PA, increase acuity and place in treatment room.

## 2024-02-21 NOTE — ED Notes (Signed)
 No response for vitals - multiple attempts

## 2024-02-21 NOTE — ED Triage Notes (Signed)
 PT c/o asthma attack, ongoing for 2 days. Has been using his inhaler with no relief.

## 2024-05-11 ENCOUNTER — Emergency Department (HOSPITAL_BASED_OUTPATIENT_CLINIC_OR_DEPARTMENT_OTHER)
Admission: EM | Admit: 2024-05-11 | Discharge: 2024-05-11 | Disposition: A | Discharge status: Home / Self Care | Attending: Emergency Medicine | Admitting: Emergency Medicine

## 2024-05-11 ENCOUNTER — Other Ambulatory Visit: Payer: Self-pay

## 2024-05-11 ENCOUNTER — Encounter (HOSPITAL_BASED_OUTPATIENT_CLINIC_OR_DEPARTMENT_OTHER): Payer: Self-pay | Admitting: Emergency Medicine

## 2024-05-11 DIAGNOSIS — R059 Cough, unspecified: Secondary | ICD-10-CM | POA: Diagnosis present

## 2024-05-11 DIAGNOSIS — Z79899 Other long term (current) drug therapy: Secondary | ICD-10-CM | POA: Diagnosis not present

## 2024-05-11 DIAGNOSIS — J101 Influenza due to other identified influenza virus with other respiratory manifestations: Secondary | ICD-10-CM | POA: Diagnosis not present

## 2024-05-11 LAB — RESP PANEL BY RT-PCR (RSV, FLU A&B, COVID)  RVPGX2
Influenza A by PCR: POSITIVE — AB
Influenza B by PCR: NEGATIVE
Resp Syncytial Virus by PCR: NEGATIVE
SARS Coronavirus 2 by RT PCR: NEGATIVE

## 2024-05-11 NOTE — ED Triage Notes (Signed)
 Cough cold fever headache x 3 days Ibuprofen  around 1 pm

## 2024-05-11 NOTE — ED Provider Notes (Signed)
 " [place] EM[facility]GENCY DEPARTMENT AT [facility] Provider Note   CSN: [id number] Arrival date & time: 05/11/24  1501     Patient presents with: Cough   Lunden Mcleish is a 43 y.o. male.   43 year old male with cough fever headache for 3 days.  He has a history of asthma, has been using his inhaler with his usual frequency.  Has been taking over-the-counter medications.   Cough      Prior to Admission medications  Medication Sig Start Date End Date Taking? Authorizing Provider  albuterol  (PROVENTIL  HFA;VENTOLIN  HFA) 108 (90 Base) MCG/ACT inhaler Inhale 1-2 puffs into the lungs every 6 (six) hours as needed for wheezing or shortness of breath. 09/27/17   Babara, Amy V, PA-C  amLODipine  (NORVASC ) 2.5 MG tablet Take 1 tablet (2.5 mg total) by mouth daily. 10/06/16   Durenda Alston SAUNDERS, FNP  azithromycin  (ZITHROMAX ) 250 MG tablet Take 1 tablet (250 mg total) by mouth daily. Take first 2 tablets together, then 1 every day until finished. 09/27/17   Babara, Amy V, PA-C  fluticasone  (FLONASE ) 50 MCG/ACT nasal spray Place 2 sprays into both nostrils daily. 09/27/17   Babara, Amy V, PA-C  fluticasone  (FLOVENT  HFA) 220 MCG/ACT inhaler Inhale 1 puff into the lungs 2 (two) times daily. 10/06/16   Durenda Alston SAUNDERS, FNP  hydrochlorothiazide  (HYDRODIURIL ) 25 MG tablet Take 1 tablet (25 mg total) by mouth daily. 10/06/16   Hairston, Mandesia R, FNP  ipratropium (ATROVENT ) 0.06 % nasal spray Place 2 sprays into both nostrils 4 (four) times daily. 09/27/17   Babara, Amy V, PA-C  montelukast  (SINGULAIR ) 10 MG tablet Take 1 tablet (10 mg total) by mouth at bedtime. 10/06/16   Durenda Alston SAUNDERS, FNP  oxyCODONE-acetaminophen  (PERCOCET) 10-325 MG per tablet Take 1 tablet by mouth every 4 (four) hours as needed for pain.    [provider]  triamcinolone  cream (KENALOG ) 0.1 % Apply 1 application topically 2 (two) times daily. 08/31/16   Tharon Lenis, NP    Allergies: Patient has no known allergies.     Review of Systems  Respiratory:  Positive for cough.     Updated Vital Signs BP 135/88   Pulse (!) 106   Temp 97.9 F (36.6 C) (Oral)   Resp 20   SpO2 97%   Physical Exam Vitals and nursing note reviewed.  Constitutional:      Appearance: He is obese.  HENT:     Head: Normocephalic and atraumatic.  Cardiovascular:     Rate and Rhythm: Normal rate.     Comments: Patient with normal heart rate when I auscultate in the room. Pulmonary:     Effort: Pulmonary effort is normal. No respiratory distress.     Breath sounds: Normal breath sounds. No wheezing.  Musculoskeletal:        General: Normal range of motion.  Neurological:     General: No focal deficit present.     Mental Status: He is alert.     (all labs ordered are listed, but only abnormal results are displayed) Labs Reviewed  RESP PANEL BY RT-PCR (RSV, FLU A&B, COVID)  RVPGX2 - Abnormal; Notable for the following components:      Result Value   Influenza A by PCR POSITIVE (*)    All other components within normal limits    EKG: None  Radiology: No results found.   Procedures   Medications Ordered in the ED - No data to display  Medical Decision Making 43 year old male with influenza.  Plan -clear lungs, no respiratory distress.  Advised him to use his inhaler as needed.  No occasion for labs or imaging.  Will discharge.  This patient's health care is complicated by the following social determinants of health-lack of access to primary care.        Final diagnoses:  Influenza A    ED Discharge Orders     None          Mannie Fairy DASEN, DO 05/11/24 1836  "
